# Patient Record
Sex: Male | Born: 1960 | Race: White | Hispanic: No | Marital: Married | Smoking: Never smoker
Health system: Southern US, Community
[De-identification: ages and names within clinical notes are randomized; demographics above are authoritative.]

## PROBLEM LIST (undated history)

## (undated) DIAGNOSIS — G5600 Carpal tunnel syndrome, unspecified upper limb: Secondary | ICD-10-CM

## (undated) DIAGNOSIS — K5792 Diverticulitis of intestine, part unspecified, without perforation or abscess without bleeding: Secondary | ICD-10-CM

## (undated) DIAGNOSIS — E785 Hyperlipidemia, unspecified: Secondary | ICD-10-CM

## (undated) DIAGNOSIS — G4733 Obstructive sleep apnea (adult) (pediatric): Secondary | ICD-10-CM

## (undated) DIAGNOSIS — J301 Allergic rhinitis due to pollen: Secondary | ICD-10-CM

## (undated) DIAGNOSIS — I10 Essential (primary) hypertension: Principal | ICD-10-CM

## (undated) HISTORY — DX: Diverticulitis of intestine, part unspecified, without perforation or abscess without bleeding: K57.92

## (undated) HISTORY — DX: Obstructive sleep apnea (adult) (pediatric): G47.33

## (undated) HISTORY — DX: Essential (primary) hypertension: I10

## (undated) HISTORY — DX: Allergic rhinitis due to pollen: J30.1

## (undated) HISTORY — PX: KNEE ARTHROSCOPY: SUR90

## (undated) HISTORY — PX: OTHER SURGICAL HISTORY: SHX169

## (undated) HISTORY — DX: Carpal tunnel syndrome, unspecified upper limb: G56.00

## (undated) HISTORY — DX: Hyperlipidemia, unspecified: E78.5

---

## 1999-07-16 ENCOUNTER — Ambulatory Visit (HOSPITAL_COMMUNITY): Admission: RE | Admit: 1999-07-16 | Discharge: 1999-07-16 | Payer: Self-pay | Admitting: Family Medicine

## 1999-07-16 ENCOUNTER — Encounter: Payer: Self-pay | Admitting: Family Medicine

## 2003-10-28 ENCOUNTER — Other Ambulatory Visit: Payer: Self-pay

## 2003-11-24 ENCOUNTER — Ambulatory Visit (HOSPITAL_BASED_OUTPATIENT_CLINIC_OR_DEPARTMENT_OTHER): Admission: RE | Admit: 2003-11-24 | Discharge: 2003-11-24 | Payer: Self-pay | Admitting: Family Medicine

## 2006-04-24 ENCOUNTER — Emergency Department: Payer: Self-pay | Admitting: Emergency Medicine

## 2006-05-05 ENCOUNTER — Ambulatory Visit: Payer: Self-pay | Admitting: Family Medicine

## 2007-09-24 ENCOUNTER — Ambulatory Visit: Payer: Self-pay | Admitting: Family Medicine

## 2007-09-24 ENCOUNTER — Encounter (INDEPENDENT_AMBULATORY_CARE_PROVIDER_SITE_OTHER): Payer: Self-pay | Admitting: Internal Medicine

## 2008-12-10 ENCOUNTER — Ambulatory Visit: Payer: Self-pay | Admitting: Family Medicine

## 2008-12-10 DIAGNOSIS — M25539 Pain in unspecified wrist: Secondary | ICD-10-CM | POA: Insufficient documentation

## 2009-02-18 ENCOUNTER — Encounter: Payer: Self-pay | Admitting: Family Medicine

## 2009-03-27 ENCOUNTER — Ambulatory Visit: Payer: Self-pay | Admitting: Family Medicine

## 2009-03-27 DIAGNOSIS — R21 Rash and other nonspecific skin eruption: Secondary | ICD-10-CM | POA: Insufficient documentation

## 2009-04-12 ENCOUNTER — Encounter: Payer: Self-pay | Admitting: Family Medicine

## 2009-06-18 ENCOUNTER — Encounter: Payer: Self-pay | Admitting: Family Medicine

## 2009-12-28 ENCOUNTER — Ambulatory Visit: Payer: Self-pay | Admitting: Internal Medicine

## 2009-12-28 ENCOUNTER — Emergency Department (HOSPITAL_COMMUNITY): Admission: EM | Admit: 2009-12-28 | Discharge: 2009-12-28 | Payer: Self-pay | Admitting: Emergency Medicine

## 2009-12-28 ENCOUNTER — Encounter (INDEPENDENT_AMBULATORY_CARE_PROVIDER_SITE_OTHER): Payer: Self-pay | Admitting: *Deleted

## 2009-12-28 DIAGNOSIS — R109 Unspecified abdominal pain: Secondary | ICD-10-CM | POA: Insufficient documentation

## 2009-12-28 DIAGNOSIS — R1031 Right lower quadrant pain: Secondary | ICD-10-CM | POA: Insufficient documentation

## 2009-12-28 LAB — CONVERTED CEMR LAB
Bilirubin Urine: NEGATIVE
Blood in Urine, dipstick: NEGATIVE
Glucose, Urine, Semiquant: NEGATIVE
Ketones, urine, test strip: NEGATIVE
Nitrite: NEGATIVE
Protein, U semiquant: 30
Specific Gravity, Urine: 1.01
Urobilinogen, UA: 0.2
WBC Urine, dipstick: NEGATIVE
pH: 6

## 2009-12-30 ENCOUNTER — Ambulatory Visit: Payer: Self-pay | Admitting: Family Medicine

## 2009-12-30 DIAGNOSIS — K5732 Diverticulitis of large intestine without perforation or abscess without bleeding: Secondary | ICD-10-CM | POA: Insufficient documentation

## 2010-05-17 ENCOUNTER — Ambulatory Visit: Payer: Self-pay | Admitting: Family Medicine

## 2010-05-17 DIAGNOSIS — J069 Acute upper respiratory infection, unspecified: Secondary | ICD-10-CM | POA: Insufficient documentation

## 2010-08-08 ENCOUNTER — Ambulatory Visit: Payer: Self-pay | Admitting: Family Medicine

## 2010-08-08 ENCOUNTER — Telehealth: Payer: Self-pay | Admitting: Gastroenterology

## 2010-08-08 DIAGNOSIS — R1084 Generalized abdominal pain: Secondary | ICD-10-CM | POA: Insufficient documentation

## 2010-08-08 DIAGNOSIS — K625 Hemorrhage of anus and rectum: Secondary | ICD-10-CM | POA: Insufficient documentation

## 2010-08-11 LAB — CONVERTED CEMR LAB
ALT: 63 units/L — ABNORMAL HIGH (ref 0–53)
AST: 53 units/L — ABNORMAL HIGH (ref 0–37)
Albumin: 4.6 g/dL (ref 3.5–5.2)
Alkaline Phosphatase: 66 units/L (ref 39–117)
BUN: 12 mg/dL (ref 6–23)
Basophils Absolute: 0.1 10*3/uL (ref 0.0–0.1)
Basophils Relative: 1.5 % (ref 0.0–3.0)
Bilirubin, Direct: 0.1 mg/dL (ref 0.0–0.3)
CO2: 30 meq/L (ref 19–32)
Calcium: 9.5 mg/dL (ref 8.4–10.5)
Chloride: 101 meq/L (ref 96–112)
Creatinine, Ser: 1.1 mg/dL (ref 0.4–1.5)
Eosinophils Absolute: 0.2 10*3/uL (ref 0.0–0.7)
Eosinophils Relative: 3.1 % (ref 0.0–5.0)
GFR calc non Af Amer: 73.55 mL/min (ref >60)
Glucose, Bld: 93 mg/dL (ref 70–99)
HCT: 43.4 % (ref 39.0–52.0)
Hemoglobin: 14.4 g/dL (ref 13.0–17.0)
Lipase: 40 units/L (ref 11.0–59.0)
Lymphocytes Relative: 25.6 % (ref 12.0–46.0)
Lymphs Abs: 1.6 10*3/uL (ref 0.7–4.0)
MCHC: 33.3 g/dL (ref 30.0–36.0)
MCV: 84.1 fL (ref 78.0–100.0)
Monocytes Absolute: 0.6 10*3/uL (ref 0.1–1.0)
Monocytes Relative: 9.5 % (ref 3.0–12.0)
Neutro Abs: 3.7 10*3/uL (ref 1.4–7.7)
Neutrophils Relative %: 60.3 % (ref 43.0–77.0)
Platelets: 242 10*3/uL (ref 150.0–400.0)
Potassium: 4.7 meq/L (ref 3.5–5.1)
RBC: 5.16 M/uL (ref 4.22–5.81)
RDW: 14.9 % — ABNORMAL HIGH (ref 11.5–14.6)
Sodium: 139 meq/L (ref 135–145)
Total Bilirubin: 0.6 mg/dL (ref 0.3–1.2)
Total Protein: 7.6 g/dL (ref 6.0–8.3)
WBC: 6.2 10*3/uL (ref 4.5–10.5)

## 2010-08-12 ENCOUNTER — Ambulatory Visit: Payer: Self-pay | Admitting: Gastroenterology

## 2010-08-12 ENCOUNTER — Ambulatory Visit: Payer: Self-pay | Admitting: Cardiovascular Disease

## 2010-08-12 DIAGNOSIS — R197 Diarrhea, unspecified: Secondary | ICD-10-CM | POA: Insufficient documentation

## 2010-08-12 DIAGNOSIS — K5730 Diverticulosis of large intestine without perforation or abscess without bleeding: Secondary | ICD-10-CM | POA: Insufficient documentation

## 2010-08-14 HISTORY — PX: COLONOSCOPY: SHX174

## 2010-08-15 ENCOUNTER — Telehealth: Payer: Self-pay | Admitting: Gastroenterology

## 2010-08-20 ENCOUNTER — Encounter (INDEPENDENT_AMBULATORY_CARE_PROVIDER_SITE_OTHER): Payer: Self-pay | Admitting: *Deleted

## 2010-08-21 ENCOUNTER — Ambulatory Visit: Payer: Self-pay | Admitting: Gastroenterology

## 2010-08-25 ENCOUNTER — Ambulatory Visit: Payer: Self-pay | Admitting: Gastroenterology

## 2010-08-25 LAB — HM COLONOSCOPY

## 2010-08-26 ENCOUNTER — Encounter (INDEPENDENT_AMBULATORY_CARE_PROVIDER_SITE_OTHER): Payer: Self-pay | Admitting: *Deleted

## 2010-09-30 ENCOUNTER — Encounter (INDEPENDENT_AMBULATORY_CARE_PROVIDER_SITE_OTHER): Payer: Self-pay | Admitting: *Deleted

## 2010-09-30 ENCOUNTER — Ambulatory Visit: Payer: Self-pay | Admitting: Gastroenterology

## 2010-10-20 ENCOUNTER — Telehealth: Payer: Self-pay | Admitting: Gastroenterology

## 2010-10-21 ENCOUNTER — Ambulatory Visit: Payer: Self-pay | Admitting: Gastroenterology

## 2010-10-21 LAB — CONVERTED CEMR LAB: Fecal Occult Bld: NEGATIVE

## 2010-12-14 DIAGNOSIS — K5792 Diverticulitis of intestine, part unspecified, without perforation or abscess without bleeding: Secondary | ICD-10-CM

## 2010-12-14 HISTORY — DX: Diverticulitis of intestine, part unspecified, without perforation or abscess without bleeding: K57.92

## 2011-01-13 NOTE — Assessment & Plan Note (Signed)
Summary: F/U [place] ON 12/28/09/CLE   Vital Signs:  Patient profile:   50 year old male Height:      67.5 inches Weight:      207.4 pounds BMI:     32.12 Temp:     97.7 degrees F oral Pulse rate:   100 / minute Pulse rhythm:   regular BP sitting:   160 / 90  (left arm) Cuff size:   regular  Vitals Entered By: Benny Lennert CMA Duncan Dull) (December 30, 2009 11:54 AM)  History of Present Illness: Chief complaint er follow up 12-28-09 diagnosed with diverticulitis  50 year old male:  Diverticuluitis: the patient was seen in the emergency room, and was subsequently diagnosed with diverticulitis. He is being treated with Flagyl and ciprofloxacin, he is subsequently improved over the last 3 days. he is still not eating any solid food, however he is drinking without any difficulties.  At this point he is not having any fever.  getting better, cipro and flagyl  no family history of colon cancer.  He is having any blood in his stool. Generally he is feeling better.  Allergies: 1)  ! Sb Antihistamine (Triprolidine-Pseudoephedrine) 2)  ! * Lorabid/antibiotic  Past History:  Past medical, surgical, family and social histories (including risk factors) reviewed, and no changes noted (except as noted below).  Past Medical History: CTS Obstructive sleep apnea--mild Diverticulitis, hx of  Past Surgical History: Reviewed history from 12/28/2009 and no changes required. Arthroscopy both knees (meniscus repairs)  Family History: Reviewed history and no changes required.  Social History: Reviewed history from 12/28/2009 and no changes required. Married--2 children Never Smoked Alcohol use-yes Occupation:  Armed forces training and education officer  Review of Systems General:  Complains of fatigue. CV:  Denies chest pain or discomfort. Resp:  Denies cough. GI:  Complains of abdominal pain and nausea; denies bloody stools.  Physical Exam  General:  Well-developed,well-nourished,in no acute distress;  alert,appropriate and cooperative throughout examination Head:  Normocephalic and atraumatic without obvious abnormalities. No apparent alopecia or balding. Ears:  no external deformities.   Nose:  no external deformity.   Neck:  No deformities, masses, or tenderness noted. Lungs:  Normal respiratory effort, chest expands symmetrically. Lungs are clear to auscultation, no crackles or wheezes. Heart:  Normal rate and regular rhythm. S1 and S2 normal without gallop, murmur, click, rub or other extra sounds. Abdomen:  mild left lower quadrant tenderness.soft, normal bowel sounds, no distention, no masses, no guarding, and no rigidity.     Impression & Recommendations:  Problem # 1:  DIVERTICULITIS, ACUTE (ICD-562.11) Assessment New c/w plan of care, cipro, flagyl  advance diet as tol  discussed that the ER had him f/u with Dr. Loreta Ave scheduled. My practice pattern is not usually for someone to f/u with GI for routine resolving diverticulitis, and we discussed that GI follow-up probably not indicated as long as getting better.  Complete Medication List: 1)  Ciprofloxacin Hcl 500 Mg Tabs (Ciprofloxacin hcl) .... Take 1 tablet by mouth 2 times a day 2)  Metronidazole 500 Mg Tabs (Metronidazole) .... Take 1 tab 3 times daily 3)  Ondansetron Hcl 8 Mg Tabs (Ondansetron hcl) .... Take one as needed every 4-6 hours  Current Allergies (reviewed today): ! SB ANTIHISTAMINE (TRIPROLIDINE-PSEUDOEPHEDRINE) ! * LORABID/ANTIBIOTIC

## 2011-01-13 NOTE — Assessment & Plan Note (Signed)
Summary: follow up colonoscopy/lk   History of Present Illness Visit Type: Follow-up Visit Primary GI MD: Sheryn Bison MD FACP FAGA Primary Provider: Kerin Perna, MD Chief Complaint: Occasional constipation History of Present Illness:   colonoscopy was unremarkable except for some thickened haustral folds. He is currently on a high fiber diet and daily Metamucil and is fairly asymptomatic. He denies abdominal pain, rectal bleeding, or systemic complaints. He has had at least 2 episodes of documented diverticulitis.   GI Review of Systems      Denies abdominal pain, acid reflux, belching, bloating, chest pain, dysphagia with liquids, dysphagia with solids, heartburn, loss of appetite, nausea, vomiting, vomiting blood, weight loss, and  weight gain.      Reports constipation.     Denies anal fissure, black tarry stools, change in bowel habit, diarrhea, diverticulosis, fecal incontinence, heme positive stool, hemorrhoids, irritable bowel syndrome, jaundice, light color stool, liver problems, rectal bleeding, and  rectal pain.    Current Medications (verified): 1)  Multivitamins  Tabs (Multiple Vitamin) .... Once Daily 2)  B Complex Formula 1  Tabs (Vitamins-Lipotropics) .... Once Daily 3)  Vitamin C 1000 Mg Tabs (Ascorbic Acid) .... Once Daily 4)  Ketoconazole 2 % Crea (Ketoconazole) .... Apply Two Times A Day As Needed 5)  Analpram-Hc 1-2.5 % Crea (Hydrocortisone Ace-Pramoxine) .... Use Rectal Cream 2-3 Times Daily As Needed  Allergies (verified): 1)  ! Sb Antihistamine (Triprolidine-Pseudoephedrine) 2)  ! * Lorabid/antibiotic  Past History:  Past medical, surgical, family and social histories (including risk factors) reviewed for relevance to current acute and chronic problems.  Past Medical History: Reviewed history from 08/12/2010 and no changes required. CTS Obstructive sleep apnea--mild Diverticulitis, hx of  Past Surgical History: Reviewed history from 08/29/2010  and no changes required. Arthroscopy both knees (meniscus)  Colonoscopy, 07/2010, diverticulosis, 10 yr recall  Family History: Reviewed history from 08/12/2010 and no changes required. Father had seven inches of colon removed.  many polyps noncancerous mass removed Family History of Heart Disease: MGF Family History of Kidney Disease:PGF  Social History: Reviewed history from 08/12/2010 and no changes required. Married--2 children Never Smoked Alcohol use-yes Occupation:  Armed forces training and education officer Daily Caffeine Use 2 cups  Review of Systems       The patient complains of allergy/sinus, arthritis/joint pain, and sleeping problems.  The patient denies anemia, anxiety-new, back pain, blood in urine, breast changes/lumps, change in vision, confusion, cough, coughing up blood, depression-new, fainting, fatigue, fever, headaches-new, hearing problems, heart murmur, heart rhythm changes, itching, menstrual pain, muscle pains/cramps, night sweats, nosebleeds, pregnancy symptoms, shortness of breath, skin rash, sore throat, swelling of feet/legs, swollen lymph glands, thirst - excessive , urination - excessive , urination changes/pain, urine leakage, vision changes, and voice change.    Vital Signs:  Patient profile:   50 year old male Height:      67.5 inches Weight:      199.25 pounds BMI:     30.86 Pulse rate:   84 / minute Pulse rhythm:   regular BP sitting:   116 / 80  (left arm) Cuff size:   regular  Vitals Entered By: June McMurray CMA Duncan Dull) (September 30, 2010 3:51 PM)  Physical Exam  General:  Well developed, well nourished, no acute distress.healthy appearing.  healthy appearing.   Head:  Normocephalic and atraumatic. Eyes:  PERRLA, no icterus. Abdomen:  Soft, nontender and nondistended. No masses, hepatosplenomegaly or hernias noted. Normal bowel sounds. Psych:  Alert and cooperative. Normal mood and affect.  Impression & Recommendations:  Problem # 1:  DIVERTICULOSIS-COLON  (ICD-562.10) Assessment Improved Continue high-fiber diet, daily Metamucil, and liberal p.o. fluids.Stool cards for human hemoglobin analysis ordered. He will see me on a p.r.n. basis as needed. Diverticulitis and its management again reviewed with patient.  Problem # 2:  DIARRHEA (ICD-787.91) Assessment: Improved  Problem # 3:  ABDOMINAL PAIN, GENERALIZED (ICD-789.07) Assessment: Improved  Patient Instructions: 1)  Copy sent to : Kerin Perna, MD 2)  Please go to the basement for your iFOB test, we gave you a seperate sheet for the lab.  3)  The medication list was reviewed and reconciled.  All changed / newly prescribed medications were explained.  A complete medication list was provided to the patient / caregiver. 4)  Diet should be high in fiber ( fruits, vegetables, whole grains) but low in residue. Drink at least eight (8) glasses of water a day.

## 2011-01-13 NOTE — Assessment & Plan Note (Signed)
Summary: R HAND/CLE   Vital Signs:  Patient Profile:   50 Years Old Male Weight:      212 pounds Temp:     98.7 degrees F oral Pulse rate:   80 / minute Pulse rhythm:   regular BP sitting:   130 / 80  (left arm) Cuff size:   regular  Vitals Entered By: Mervin Hack CMA (December 10, 2008 9:48 AM)                 History of Present Illness: 50 year old male presents with R hand pain:  Last thursday, hit some black ice. Slipped and reached for a metal trailer. Hit his right hand. Hurt the next morning. No bruising. OK to type fine. FOOSH with extension  Shaking hands bothers him. Opens a door bothers him. Pain with forced extension. Minimal swelling.  Denies prior fracture or surgery of the effected hand    Current Allergies (reviewed today): ! SB ANTIHISTAMINE (TRIPROLIDINE-PSEUDOEPHEDRINE)  Past Medical History:    CTS     Review of Systems       REVIEW OF SYSTEMS  GEN: No systemic complaints, no fevers, chills, sweats, or other acute illnesses  MSK: Detailed in the HPI GI: tolerating PO intake without difficulty  Neuro: No new numbness, parasthesias, or tingling associated with injury - has some at baseline  Otherwise the pertinent positives of the ROS are noted above. Further ROS may be demarcated in the ROS section of Centricity, If none, then this plus the HPI constitutes the ROS.     Physical Exam  General:     Well-developed,well-nourished,in no acute distress; alert,appropriate and cooperative throughout examination Head:     Normocephalic and atraumatic without obvious abnormalities. No apparent alopecia or balding. Ears:     no external deformities.   Nose:     no external deformity.   Pulses:     radial pulses intact Additional Exam:     R hand Ecchymosis or edema: mild dorsal swelling ROM wrist/hand/digits/elbow: full  Carpals, MCP's, digits: minimally tender dorsally Distal Ulna and Radius: NT Cysts/nodules: neg Finkelstein's  test: neg Snuffbox tenderness: neg Scaphoid tubercle: NT Hook of Hamate: NT Resisted supination: NT Full composite fist Grip, all digits: 5/5 str No tenosynovitis Axial load test: mildly tender Atrophy: neg  Hand sensation: intact     Impression & Recommendations:  Problem # 1:  WRIST PAIN, RIGHT (ZOX-096.04) Assessment: New Dorsal synovitis, traumatic Clinically does not appear to be fracture  Using an anatomy book, I reviewed with the patient the structures involved and how they related to her diagnosis. The patient indicated that they understood our discussion and the anatomy involved.   Alleve 2 tabs by mouth two times a day over the counter, take at least for 2 - 3 weeks. This is a prescription strength dose.  Ice daily, 2-3 times for 1 week  Placed in cock-up wrist splint to be used over next 2-3 weeks then wean out f/u if not getting better Orders: No RX's Sent Electronically/Printed [phone])     ] Prior Medications: None Current Allergies (reviewed today): ! SB ANTIHISTAMINE (TRIPROLIDINE-PSEUDOEPHEDRINE)

## 2011-01-13 NOTE — Assessment & Plan Note (Signed)
Summary: SINUSES/INTESTINAL PROBLEMS/RCD   Vital Signs:  Patient profile:   50 year old male Weight:      205 pounds O2 Sat:      98 % Temp:     98.0 degrees F oral Pulse rate:   88 / minute BP sitting:   130 / 80  Vitals Entered By: Lamar Sprinkles, CMA (May 17, 2010 9:25 AM) CC: Pt c/o sinus congestion/pressure/drainage x 1 wk. No fever, c/o bodyaches & chills last night. Yesterday evening c/o diarrhea w/abd discomfort. Has hx of diverticulitis.    History of Present Illness: 50 yo man here today for  1) Sinus congestion- sxs started Monday w/ nasal congestion.  + rhinorrhea.  + head congestion.  no facial pain.  taking mucinex.  + ear fullness.  mild cough.  no sore throat.  2) abd pain- started yesterday afternoon, located across upper abd.  + nausea last night, no vomiting.  diarrhea x3 yesterday afternoon.  started clear liquid diet yesterday.  pain has improved this AM.  no fevers.  hx of diverticulitis.  Current Medications (verified): 1)  Multivitamins  Tabs (Multiple Vitamin) .... Once Daily 2)  B Complex Formula 1  Tabs (Vitamins-Lipotropics) .... Once Daily 3)  Vitamin C 1000 Mg Tabs (Ascorbic Acid) .... Once Daily  Allergies (verified): 1)  ! Sb Antihistamine (Triprolidine-Pseudoephedrine) 2)  ! * Lorabid/antibiotic  Review of Systems      See HPI  Physical Exam  General:  Well-developed,well-nourished,in no acute distress; alert,appropriate and cooperative throughout examination Head:  Normocephalic and atraumatic without obvious abnormalities. No apparent alopecia or balding.  no TTP over sinuses Eyes:  no injxn or inflammation Ears:  External ear exam shows no significant lesions or deformities.  Otoscopic examination reveals clear canals, tympanic membranes are intact bilaterally without bulging, retraction, inflammation or discharge. Hearing is grossly normal bilaterally. Nose:  mild congestion Mouth:  + PND Neck:  No deformities, masses, or tenderness  noted. Lungs:  Normal respiratory effort, chest expands symmetrically. Lungs are clear to auscultation, no crackles or wheezes. Heart:  Normal rate and regular rhythm. S1 and S2 normal without gallop, murmur, click, rub or other extra sounds. Abdomen:  soft, mild TTP over LLQ, no rebound/guarding   Impression & Recommendations:  Problem # 1:  URI (ICD-465.9) Assessment New pt sxs likely viral- no evidence of bacterial infxn on exam.  reviewed supportive care and red flags that should prompt return.  Pt expresses understanding and is in agreement w/ this plan.  Problem # 2:  DIVERTICULITIS, ACUTE (ICD-562.11) Assessment: Unchanged pt w/ possible early diverticulitis althought sxs are so mild it's hard to say.  may all be related to viral illness- see above.  scripts for cipro and flagyl given w/ instructions to start only if pain recurs or worsens.  no need for CT today.  reviewed supportive care and red flags that should prompt return.  Pt expresses understanding and is in agreement w/ this plan.  Complete Medication List: 1)  Multivitamins Tabs (Multiple vitamin) .... Once daily 2)  B Complex Formula 1 Tabs (Vitamins-lipotropics) .... Once daily 3)  Vitamin C 1000 Mg Tabs (Ascorbic acid) .... Once daily 4)  Cipro 500 Mg Tabs (Ciprofloxacin hcl) .Marland Kitchen.. 1 by mouth 2 times daily 5)  Metronidazole 500 Mg Tabs (Metronidazole) .Marland Kitchen.. 1 tab by mouth three times a day x14 days  Patient Instructions: 1)  This appears to be viral and should improve with time 2)  Drink plenty of fluids- advance  diet as tolerated 3)  If your abd pain returns- start the medicines and call Dr Patsy Lager 4)  Mucinex as needed for congestion 5)  Call with any questions or concerns 6)  Hang in there! Prescriptions: METRONIDAZOLE 500 MG  TABS (METRONIDAZOLE) 1 tab by mouth three times a day x14 days  #42 x 0   Entered and Authorized by:   Neena Rhymes MD   Signed by:   Neena Rhymes MD on 05/17/2010   Method used:    [facility]        [facility]  [place] (retail)       [address]       [place], [place]  [address]       Ph: [phone] or [phone]       Fax: [phone]   RxID:   [id number] CIPRO 500 MG TABS (CIPROFLOXACIN HCL) 1 by mouth 2 times daily  #28 x 0   Entered and Authorized by:   Neena Rhymes MD   Signed by:   Neena Rhymes MD on 05/17/2010   Method used:   [facility]        [facility]  [place] (retail)       [address]       [place], [place]  [address]       Ph: [phone] or [phone]       Fax: [phone]   RxID:   [id number]    Immunization History:  Tetanus/Td Immunization History:    Tetanus/Td:  historical (04/13/2005)

## 2011-01-13 NOTE — Procedures (Signed)
Summary: Colonoscopy  Patient: [name] Note: All result statuses are Final unless otherwise noted.  Tests: (1) Colonoscopy (COL)   COL Colonoscopy           DONE     [facility]     [address]     [place], [place]  [address]           COLONOSCOPY PROCEDURE REPORT           PATIENT:  Jeffrey, Barry  MR#:  [id number]     BIRTHDATE:  Dec 24, 1960, 49 yrs. old  GENDER:  male     ENDOSCOPIST:  Vania Rea. Jarold Motto, MD, [facility]     REF. BY:     PROCEDURE DATE:  08/25/2010     PROCEDURE:  Average-risk screening colonoscopy     G0121     ASA CLASS:  Class I     INDICATIONS:  Abdominal pain, hematochezia, Routine Risk Screening           MEDICATIONS:   Fentanyl 75 mcg IV, Versed 7 mg IV           DESCRIPTION OF PROCEDURE:   After the risks benefits and     alternatives of the procedure were thoroughly explained, informed     consent was obtained.  Digital rectal exam was performed and     revealed no abnormalities.   The LB [id number] endoscope     was introduced through the anus and advanced to the cecum, which     was identified by both the appendix and ileocecal valve, without     limitations.  The quality of the prep was excellent, using     MoviPrep.  The instrument was then slowly withdrawn as the colon     was fully examined.     <<PROCEDUREIMAGES>>           FINDINGS:  Moderate diverticulosis was found in the sigmoid to     descending colon segments. thickened,red haustral folds noted.  No     polyps or cancers were seen.  This was otherwise a normal     examination of the colon.   Retroflexed views in the rectum     revealed no abnormalities.    The scope was then withdrawn from     the patient and the procedure completed.           COMPLICATIONS:  None     ENDOSCOPIC IMPRESSION:     1) Moderate diverticulosis in the sigmoid to descending colon     segments     2) No polyps or cancers     3) Otherwise normal examination     RECOMMENDATIONS:     1)  Continue current colorectal screening recommendations for     "routine risk" patients with a repeat colonoscopy in 10 years.     REPEAT EXAM:  No           ______________________________     Vania Rea. Jarold Motto, MD, Clement[facility] Graham           CC:  Juleen China, MD           n.     [name] at 08/25/2010 04:22 PM           Ursula Beath, [phone]  Note: An exclamation mark (!) indicates a result that was not dispersed into the flowsheet. Document Creation Date: 08/25/2010 4:23 PM _______________________________________________________________________  (1) Order result status: Final Collection  or observation date-time: 08/25/2010 16:16 Requested date-time:  Receipt date-time:  Reported date-time:  Referring Physician:   Ordering Physician: Sheryn Bison [phone]) Specimen Source:  Source: Launa Grill Order Number: [id number] Lab site:   Appended Document: Colonoscopy    Clinical Lists Changes  Observations: Added new observation of COLONNXTDUE: 08/2020 (08/25/2010 16:29)      Appended Document: Colonoscopy    Clinical Lists Changes  Observations: Added new observation of PAST SURG HX: Arthroscopy both knees (meniscus)  Colonoscopy, 07/2010, diverticulosis, 10 yr recall (08/29/2010 6:29)     Past History:  Past Surgical History: Arthroscopy both knees (meniscus)  Colonoscopy, 07/2010, diverticulosis, 10 yr recall

## 2011-01-13 NOTE — Letter (Signed)
Summary: [facility]   Imported By: Lanelle Bal 06/20/2009 09:30:49  _____________________________________________________________________  External Attachment:    Type:   Image     Comment:   External Document

## 2011-01-13 NOTE — Miscellaneous (Signed)
Summary: [facility] previsit  Clinical Lists Changes  Medications: Added new medication of MOVIPREP 100 GM  SOLR (PEG-KCL-NACL-NASULF-NA ASC-C) As per prep instructions. - Signed Rx of MOVIPREP 100 GM  SOLR (PEG-KCL-NACL-NASULF-NA ASC-C) As per prep instructions.;  #1 x 0;  Signed;  Entered by: Karl Bales RN;  Authorized by: Mardella Layman MD [facility];  Method used: [facility]  [place], [place], [place]  [address], Ph: [phone] or [phone], Fax: [phone]    Prescriptions: MOVIPREP 100 GM  SOLR (PEG-KCL-NACL-NASULF-NA ASC-C) As per prep instructions.  #1 x 0   Entered by:   Karl Bales RN   Authorized by:   Mardella Layman MD [facility]   Signed by:   Karl Bales RN on 08/21/2010   Method used:   [facility] to        [facility]  [address] (retail)       [address]       [place], [place]  [address]       Ph: [phone] or [phone]       Fax: [phone]   RxID:   [id number]

## 2011-01-13 NOTE — Assessment & Plan Note (Signed)
Summary: ABD PAIN...AS.   Vital Signs:  Patient profile:   50 year old male Weight:      207 pounds Temp:     98.6 degrees F oral Pulse rate:   116 / minute BP sitting:   120 / 84  (left arm)  Vitals Entered By: Doristine Devoid (December 28, 2009 9:10 AM) CC: RLQ pain xlast night- 1 episode of diarrhea yest. and some nausea   History of Present Illness: Awoke last night with right lower abdomen pain Only relief was by pacing the floor worse with sitting or lying down Feels like he has gas and can't relieve it  persists now Some nausea--hasn't eaten or drank much Small stool  ~6AM No vomiting  No fever some chills and sweats last night  Preventive Screening-Counseling & Management  Alcohol-Tobacco     Smoking Status: never  Allergies: 1)  ! Sb Antihistamine (Triprolidine-Pseudoephedrine) 2)  ! * Lorabid/antibiotic  Past History:  Past Medical History: CTS Obstructive sleep apnea--mild  Past Surgical History: Arthroscopy both knees (meniscus repairs)  Social History: Married--2 children Never Smoked Alcohol use-yes Occupation:  Armed forces training and education officer Smoking Status:  never Occupation:  employed  Review of Systems       Notes decreased urinary stream since last night No sexual problems No dysuria No hematuria No recent strain or heavy lifting  Physical Exam  General:  alert.  NAD Neck:  supple, no masses, and no cervical lymphadenopathy.   Lungs:  normal respiratory effort and normal breath sounds.   Heart:  normal rate, regular rhythm, no murmur, and no gallop.   Abdomen:  soft and no masses.   Quiet--no audible bowel sounds Mild-moderate point tenderness in RLQ mild LLQ tenderness as well  No rebound   Impression & Recommendations:  Problem # 1:  ABDOMINAL PAIN, RIGHT LOWER QUADRANT (ICD-789.03) Assessment New not overly sick but may be early appendicitis will send to ER at [facility] now for evaluation and imaging as needed He states he has  had "mild" appendicitis diagnosed twice in the past but it improved without surgery  Triage at ER notified  Complete Medication List: 1)  Mucinex 600 Mg Xr12h-tab (Guaifenesin) .... As needed 2)  Elocon 0.1 % Crea (Mometasone furoate) .... Apply thinnlyto lesion two times a day for 1 wk  Other Orders: UA Dipstick w/o Micro (manual) ([address])  Laboratory Results   Urine Tests    Routine Urinalysis   Glucose: negative   (Normal Range: Negative) Bilirubin: negative   (Normal Range: Negative) Ketone: negative   (Normal Range: Negative) Spec. Gravity: 1.010   (Normal Range: 1.003-1.035) Blood: negative   (Normal Range: Negative) pH: 6.0   (Normal Range: 5.0-8.0) Protein: 30   (Normal Range: Negative) Urobilinogen: 0.2   (Normal Range: 0-1) Nitrite: negative   (Normal Range: Negative) Leukocyte Esterace: negative   (Normal Range: Negative)

## 2011-01-13 NOTE — Letter (Signed)
Summary: [facility] / EVAL OF ELECTIVE STERILIZATION / DR. Arlys John [place]  [facility] / EVAL OF ELECTIVE STERILIZATION / DR. Arlys John [place]   Imported By: Carin Primrose 02/20/2009 12:12:02  _____________________________________________________________________  External Attachment:    Type:   Image     Comment:   External Document

## 2011-01-13 NOTE — Letter (Signed)
Summary: [facility] Instructions  [facility]  [address] [place], [place] [address]   Phone: [phone]  Fax: [phone]       KHALIL SZCZEPANIK    Nov 28, 1961    MRN: [id number]        Procedure Day Dorna Bloom:  Duanne Limerick  08/25/10     Arrival Time:  3:00PM     Procedure Time:  4:00PM     Location of Procedure:                    Juliann Pares  [facility] (4th Floor)                      PREPARATION FOR COLONOSCOPY WITH MOVIPREP   Starting 5 days prior to your procedure 08/20/10 do not eat nuts, seeds, popcorn, corn, beans, peas,  salads, or any raw vegetables.  Do not take any fiber supplements (e.g. Metamucil, Citrucel, and Benefiber).  THE DAY BEFORE YOUR PROCEDURE         DATE: 08/24/10  DAY: SUNDAY  1.  Drink clear liquids the entire day-NO SOLID FOOD  2.  Do not drink anything colored red or purple.  Avoid juices with pulp.  No orange juice.  3.  Drink at least 64 oz. (8 glasses) of fluid/clear liquids during the day to prevent dehydration and help the prep work efficiently.  CLEAR LIQUIDS INCLUDE: Water Jello Ice Popsicles Tea (sugar ok, no milk/cream) Powdered fruit flavored drinks Coffee (sugar ok, no milk/cream) Gatorade Juice: apple, white grape, white cranberry  Lemonade Clear bullion, consomm, broth Carbonated beverages (any kind) Strained chicken noodle soup Hard Candy                             4.  In the morning, mix first dose of MoviPrep solution:    Empty 1 Pouch A and 1 Pouch B into the disposable container    Add lukewarm drinking water to the top line of the container. Mix to dissolve    Refrigerate (mixed solution should be used within 24 hrs)  5.  Begin drinking the prep at 5:00 p.m. The MoviPrep container is divided by 4 marks.   Every 15 minutes drink the solution down to the next mark (approximately 8 oz) until the full liter is complete.   6.  Follow completed prep with 16 oz of clear liquid of your choice (Nothing red  or purple).  Continue to drink clear liquids until bedtime.  7.  Before going to bed, mix second dose of MoviPrep solution:    Empty 1 Pouch A and 1 Pouch B into the disposable container    Add lukewarm drinking water to the top line of the container. Mix to dissolve    Refrigerate  THE DAY OF YOUR PROCEDURE      DATE: 08/25/10  DAY: MONDAY  Beginning at 11:00AM (5 hours before procedure):         1. Every 15 minutes, drink the solution down to the next mark (approx 8 oz) until the full liter is complete.  2. Follow completed prep with 16 oz. of clear liquid of your choice.    3. You may drink clear liquids until 2:00PM (2 HOURS BEFORE PROCEDURE).   MEDICATION INSTRUCTIONS  Unless otherwise instructed, you should take regular prescription medications with a small sip of water   as early as possible the morning of your  procedure.         OTHER INSTRUCTIONS  You will need a responsible adult at least 49 years of age to accompany you and drive you home.   This person must remain in the waiting room during your procedure.  Wear loose fitting clothing that is easily removed.  Leave jewelry and other valuables at home.  However, you may wish to bring a book to read or  an iPod/MP3 player to listen to music as you wait for your procedure to start.  Remove all body piercing jewelry and leave at home.  Total time from sign-in until discharge is approximately 2-3 hours.  You should go home directly after your procedure and rest.  You can resume normal activities the  day after your procedure.  The day of your procedure you should not:   Drive   Make legal decisions   Operate machinery   Drink alcohol   Return to work  You will receive specific instructions about eating, activities and medications before you leave.    The above instructions have been reviewed and explained to me by   Karl Bales RN  August 21, 2010 9:05 AM    I fully understand and can  verbalize these instructions _____________________________ Date _________

## 2011-01-13 NOTE — Progress Notes (Signed)
Summary: CT results  Phone Note Call from Patient Call back at Home Phone [phone]   Caller: Patient Call For: Dr. Jarold Motto Reason for Call: Lab or Test Results Summary of Call: Calling about CT results Initial call taken by: Karna Christmas,  August 15, 2010 10:51 AM  Follow-up for Phone Call        Pt informed of results.  previsit and colon sch.  Rx went for bentyl. Follow-up by: Ashok Cordia RN,  August 15, 2010 1:04 PM

## 2011-01-13 NOTE — Assessment & Plan Note (Signed)
Summary: stomach issues/alc   Vital Signs:  Patient profile:   50 year old male Height:      67.5 inches Weight:      202.4 pounds BMI:     31.35 Temp:     99.2 degrees F oral Pulse rate:   92 / minute Pulse rhythm:   regular BP sitting:   120 / 80  (left arm) Cuff size:   regular  Vitals Entered By: Benny Lennert CMA Duncan Dull) (August 08, 2010 10:38 AM)  History of Present Illness: Chief complaint stomach issues  50 year old male:  was out of town  12 days ago started, then a few a few days later  -- he thought this may be another bout of diverticulitis, and so he started some ciprofloxacin and Flagyl that he had already. Not clear if he had this from a prior time and I prescribed her from another physician..  pain in the abdomen and some pain in the back  and also in the lower abdomen multiple different places, without being particularly focal. He is able to eat but, but he does have some mild discomfort.  Additionally, he has had some rectal bleeding,, but not profuse.  He has never had a colonoscopy.  He does have a history in his family of polyps in his father, but no known colon cancer history pretty  Today is feeling better than before.   some decreased apetitie,  no bomitting or diarrhea some blood, some pain with going to the bathroom.    Allergies: 1)  ! Sb Antihistamine (Triprolidine-Pseudoephedrine) 2)  ! * Lorabid/antibiotic  Past History:  Past medical, surgical, family and social histories (including risk factors) reviewed, and no changes noted (except as noted below).  Past Medical History: Reviewed history from 12/30/2009 and no changes required. CTS Obstructive sleep apnea--mild Diverticulitis, hx of  Past Surgical History: Arthroscopy both knees (meniscus)  Family History: Reviewed history and no changes required. Father had seven inches of colon removed.  many polyps  Social History: Reviewed history from 12/28/2009 and no changes  required. Married--2 children Never Smoked Alcohol use-yes Occupation:  Armed forces training and education officer  Review of Systems General:  Complains of fatigue and loss of appetite; denies chills and fever. GI:  Complains of abdominal pain, bloody stools, change in bowel habits, constipation, and loss of appetite; denies vomiting and vomiting blood.  Physical Exam  General:  Well-developed,well-nourished,in no acute distress; alert,appropriate and cooperative throughout examination Head:  Normocephalic and atraumatic without obvious abnormalities. No apparent alopecia or balding. Ears:  no external deformities.   Nose:  no external deformity.   Lungs:  normal respiratory effort.   Abdomen:  mildly tender, nonfocal. soft, normal bowel sounds, no distention, no masses, no guarding, no rigidity, no rebound tenderness, no abdominal hernia, no inguinal hernia, no hepatomegaly, and no splenomegaly.   Extremities:  No clubbing, cyanosis, edema, or deformity noted with normal full range of motion of all joints.   Neurologic:  alert & oriented X3 and gait normal.   Cervical Nodes:  No lymphadenopathy noted Psych:  Cognition and judgment appear intact. Alert and cooperative with normal attention span and concentration. No apparent delusions, illusions, hallucinations   Impression & Recommendations:  Problem # 1:  ABDOMINAL PAIN, GENERALIZED (ICD-789.07) Assessment New abdominal pain, exact etiology unclear.  His artery had a full 10 days of metronidazole and Cipro. I don't think this is diverticulitis.  I'll check some basic laboratories. He is feeling a little bit better. At age  49, having not had a prior colonoscopy before and with some rectal bleeding, I think he does need gastroenterological evaluation. Hemoccult in the office today was negative. He did see gross blood on more than one occaision.  Orders: Venipuncture (55732) Gastroenterology Referral (GI) TLB-BMP (Basic Metabolic Panel-BMET)  (80048-METABOL) TLB-CBC Platelet - w/Differential (85025-CBCD) TLB-Hepatic/Liver Function Pnl (80076-HEPATIC) TLB-Lipase (83690-LIPASE)  Problem # 2:  RECTAL BLEEDING (ICD-569.3) Assessment: New  Orders: Gastroenterology Referral (GI) Hemoccult Guaiac-1 spec.(in office) (82270)  Complete Medication List: 1)  Multivitamins Tabs (Multiple vitamin) .... Once daily 2)  B Complex Formula 1 Tabs (Vitamins-lipotropics) .... Once daily 3)  Vitamin C 1000 Mg Tabs (Ascorbic acid) .... Once daily 4)  Metronidazole 500 Mg Tabs (Metronidazole) .Marland Kitchen.. 1 tab by mouth three times a day x14 days 5)  Ketoconazole 2 % Crea (Ketoconazole) .... Apply two times a day  Patient Instructions: 1)  Referral Appointment Information 2)  Day/Date: 3)  Time: 4)  Place/MD: 5)  Address: 6)  Phone/Fax: 7)  Patient given appointment information. Information/Orders faxed/mailed.  Prescriptions: KETOCONAZOLE 2 % CREA (KETOCONAZOLE) Apply two times a day  #60 grams x 0   Entered and Authorized by:   Hannah Beat MD   Signed by:   Hannah Beat MD on 08/08/2010   Method used:   [facility]        [facility]  [address]retail[facility]       [address]       [place], [place]  [address]       Ph: [phone] or [phone]       Fax: [phone]   RxID:   [id number]   Current Allergies (reviewed today): ! SB ANTIHISTAMINE (TRIPROLIDINE-PSEUDOEPHEDRINE) ! * LORABID/ANTIBIOTIC

## 2011-01-13 NOTE — Consult Note (Signed)
Summary: [facility]  [facility]   Imported By: Beau Fanny 09/28/2007 11:19:37  _____________________________________________________________________  External Attachment:    Type:   Image     Comment:   External Document

## 2011-01-13 NOTE — Letter (Signed)
Summary: [facility]: Immunoassay Fecal Occult Blood (iFOB) Order Form  [facility]  [address] [place], [place] [address]   Phone: [phone]  Fax: [phone]      [facility]: Immunoassay Fecal Occult Blood (iFOB) Order Form   September 30, 2010 MRN: [id number]   Jeffrey Barry [birth year]   Physicican Name: Dr. Sheryn Bison Diagnosis Code: 562.11      [name] CMA (AAMA)

## 2011-01-13 NOTE — Assessment & Plan Note (Signed)
Summary: CHECK PLACE ON LEG/CLE   Vital Signs:  Patient profile:   50 year old male Height:      67.5 inches Weight:      205 pounds BMI:     31.75 Temp:     98.6 degrees F oral Pulse rate:   72 / minute Pulse rhythm:   regular BP sitting:   112 / 80  (left arm) Cuff size:   regular  Vitals Entered By: Sydell Axon (March 27, 2009 4:09 PM)  CC:  check place on leg, has been present for 2 months, and senstive to the touch.  History of Present Illness: Here for lesion on R lower  leg for 4mo--tender to touch, itches --taking nothing --no unusual activities, contacts, does not work in the yard  Allergies: 1)  ! Sb Antihistamine (Triprolidine-Pseudoephedrine) 2)  ! * Lorabid/antibiotic  Review of Systems      See HPI  Physical Exam  General:  alert, well-developed, well-nourished, and well-hydrated.   Skin:  patch of slightly raised dapink rash on medial R lower leg, pinpoint vesicles at top of lesion, clean and skin intact, no scaling, is  ~6x12mm Psych:  normally interactive and good eye contact.     Impression & Recommendations:  Problem # 1:  SKIN RASH (ICD-782.1) Assessment New  etiology of rash is not clear, will try steriod cream for 1 wk  if not improved in 1 wk will refer to derm--agrees  His updated medication list for this problem includes:    Elocon 0.1 % Crea (Mometasone furoate) .Marland Kitchen... Apply thinnlyto lesion two times a day for 1 wk  Complete Medication List: 1)  Mucinex 600 Mg Xr12h-tab (Guaifenesin) .... As needed 2)  Elocon 0.1 % Crea (Mometasone furoate) .... Apply thinnlyto lesion two times a day for 1 wk Prescriptions: ELOCON 0.1 % CREA (MOMETASONE FUROATE) apply thinnlyto lesion two times a day for 1 wk  #1 x 0   Entered and Authorized by:   Gildardo Griffes FNP   Signed by:   Gildardo Griffes FNP on 03/27/2009   Method used:   Print then Give to Patient   RxID:   [id number]   Current Allergies (reviewed today): ! SB  ANTIHISTAMINE (TRIPROLIDINE-PSEUDOEPHEDRINE) ! * LORABID/ANTIBIOTIC

## 2011-01-13 NOTE — Letter (Signed)
Summary: Appt Reminder 2  [facility]  [address] [place], [place] [address]   Phone: [phone]  Fax: [phone]        August 26, 2010 MRN: [id number]    Jeffrey Barry [address] [place], [place]  [address]    Dear [name],   You have a return appointment with Dr. Jarold Motto on 09/30/2010 at 3:45pm.  Please remember to bring a complete list of the medicines you are taking, your insurance card and your co-pay.  If you have to cancel or reschedule this appointment, please call before 5:00 pm the evening before to avoid a cancellation fee.  If you have any questions or concerns, please call [phone].    Sincerely,    [name] CMA (AAMA)  Appended Document: Appt Reminder 2 mailed   Appended Document: Appt Reminder 2 also called and left a message on the patients machine

## 2011-01-13 NOTE — Assessment & Plan Note (Signed)
Summary: Rectal Bleeding/dfs   History of Present Illness Visit Type: Initial Visit Primary GI MD: Sheryn Bison MD FACP FAGA Primary Provider: Kerin Perna, MD Chief Complaint: rectal bleeding x 2 weeks, hemorrhoids and some rectal pain.  Pt. has had a flare of diverticulitis History of Present Illness:   50 YO MALE ,NEW TO GI TODAY. HE IS REFERRED WITH C/O RECENT DIVERTICULITIS WITH PERSISTENT SXS. INITIALLY DX IN JAN.2011 WITH DIVERTICULITIS ON CT-DESCENDING COLON. HE RESOLVED WITH ABX PER DR. Dallas Schimke. HE DID WELL UNTIL A FEW WEEKS AGO WITH RECURRENT RIGHT LOWER ABDOMINAL PAIN,AND SUPRAPUBIC PAIN. HE WAS TREATED WITH A COURSE OF CIPRO AND FLAGYL. LABS ON 8/26 UNREMARKABLE. HE JUST FINISHED THE ABX AND FEELS BETTER THAN HE DID INITIALLY BUT CONTINUES WITH AN UNSETTLED FEELING IN HIS ABDOMEN,INTERMITTENT RLQ PAIN. HE IS HAVING LOOSE STOOLS WITH 5 BM'S PER DAY. HE HAS NOTED SOME BRB WITH WIPING AND OCCASIONALLY MIXED WITH HIS STOOL OVER THE PAST COUPLE WEEKS. NO FEVER. APPETITE OK. NO N/V,WEIGHT STABLE.HE HAS HAD SOME MILD RECTAL DISCOMFORT.   GI Review of Systems      Denies abdominal pain, acid reflux, belching, bloating, chest pain, dysphagia with liquids, dysphagia with solids, heartburn, loss of appetite, nausea, vomiting, vomiting blood, weight loss, and  weight gain.      Reports change in bowel habits, diverticulosis, hemorrhoids, rectal bleeding, and  rectal pain.     Denies anal fissure, black tarry stools, constipation, diarrhea, fecal incontinence, heme positive stool, irritable bowel syndrome, jaundice, light color stool, and  liver problems.    Current Medications (verified): 1)  Multivitamins  Tabs (Multiple Vitamin) .... Once Daily 2)  B Complex Formula 1  Tabs (Vitamins-Lipotropics) .... Once Daily 3)  Vitamin C 1000 Mg Tabs (Ascorbic Acid) .... Once Daily 4)  Ketoconazole 2 % Crea (Ketoconazole) .... Apply Two Times A Day As Needed  Allergies (verified): 1)  ! Sb  Antihistamine (Triprolidine-Pseudoephedrine) 2)  ! * Lorabid/antibiotic  Past History:  Past Medical History: CTS Obstructive sleep apnea--mild Diverticulitis, hx of  Past Surgical History: Reviewed history from 08/08/2010 and no changes required. Arthroscopy both knees (meniscus)  Family History: Reviewed history from 08/08/2010 and no changes required. Father had seven inches of colon removed.  many polyps noncancerous mass removed Family History of Heart Disease: MGF Family History of Kidney Disease:PGF  Social History: Reviewed history from 12/28/2009 and no changes required. Married--2 children Never Smoked Alcohol use-yes Occupation:  Armed forces training and education officer Daily Caffeine Use 2 cups  Review of Systems       The patient complains of arthritis/joint pain.  The patient denies allergy/sinus, anemia, anxiety-new, back pain, blood in urine, breast changes/lumps, change in vision, confusion, cough, coughing up blood, depression-new, fainting, fatigue, fever, headaches-new, hearing problems, heart murmur, heart rhythm changes, itching, muscle pains/cramps, night sweats, nosebleeds, shortness of breath, skin rash, sleeping problems, sore throat, swelling of feet/legs, swollen lymph glands, thirst - excessive, urination - excessive, urination changes/pain, urine leakage, vision changes, and voice change.         SEE HPI  Vital Signs:  Patient profile:   50 year old male Height:      67.5 inches Weight:      203.8 pounds BMI:     31.56 Pulse rate:   84 / minute Pulse rhythm:   regular BP sitting:   130 / 86  (left arm)  Vitals Entered By: Milford Cage NCMA (August 12, 2010 8:43 AM)  Physical Exam  General:  Well developed, well nourished,  no acute distress. Head:  Normocephalic and atraumatic. Eyes:  PERRLA, no icterus. Lungs:  Clear throughout to auscultation. Heart:  Regular rate and rhythm; no murmurs, rubs,  or bruits. Abdomen:  SOFT,TENDER RLQ,LLQ AND SUPRAPUBIC. NO  GUARDING, NO MASS OR HSM,BS+ Rectal:  HEME  NEGATIVE,NO HEMORRHOIDS Neurologic:  Alert and  oriented x4;  grossly normal neurologically. Psych:  Alert and cooperative. Normal mood and affect.   Impression & Recommendations:  Problem # 1:  ABDOMINAL PAIN, RIGHT LOWER QUADRANT (ICD-789.03) Assessment Deteriorated 50 YO MALE WITH HXOF DIVERTICULITIS WITH RECURRENT SXS,PARTIALLY RELIEVED WITH 10 DAY COURSE OF CIPR/FLAGYL-PERSISTENT LOOSE STOOLS,SMALL VOLUME HEMATOCHEZIA,RLQ AND LLQ DISCOMFORT. R/O PERSISTENT DIVERTICULITIS,R/O UNDERLYING IBD.  CT ABD/PELVIS-IF NEGATIVE FOR DIVERTICULITIS WILL SCHEDULE FOR COLONOSCOPY WITH DR. PATTERSON ANALPRAM 2.5% CREAM-3-4 X DAILY  X 2 WEEKS THEN AS NEEDED. FURTHER PLANS PENDING CT.  Other Orders: CT Abdomen/Pelvis with Contrast (CT Abd/Pelvis w/con)  Patient Instructions: 1)  We have scheduled the CT for today at [facility].  Directions and contrast given. 2)  We have given you samples of Analpram HC cream.  3)  Copy sent to :  Kerin Perna, MD 4)  The medication list was reviewed and reconciled.  All changed / newly prescribed medications were explained.  A complete medication list was provided to the patient / caregiver. Prescriptions: ANALPRAM-HC 1-2.5 % CREA (HYDROCORTISONE ACE-PRAMOXINE) Use rectal cream 2-3 times daily as needed  #30 cc x 0   Entered by:   Lowry Ram NCMA   Authorized by:   Sammuel Cooper PA-c   Signed by:   Lowry Ram NCMA on 08/12/2010   Method used:   [facility]  [address] [facility]       [address]       [place], [place]  [address]       Ph: [phone] or [phone]       Fax: [phone]   RxID:   [id number]

## 2011-01-13 NOTE — Progress Notes (Signed)
Summary: New patient-would like pt seen next we  Phone Note Call from Patient   Caller: [phone] [place]-Dr Dallas Schimke Call For: Dr Jarold Motto ( Doc of the Day)  Summary of Call: Requesting patient be seen next week for abd pain with gross blood in stools Initial call taken by: Leanor Kail [facility],  August 08, 2010 12:16 PM  Follow-up for Phone Call        Appt sch for pt to see Amy Esterwood on 08/13/10.  [place] notified.  She will contact pt. Follow-up by: Ashok Cordia RN,  August 08, 2010 12:53 PM

## 2011-01-13 NOTE — Letter (Signed)
Summary: [facility]/Dr. Achilles Dunk  [facility]/Dr. Achilles Dunk   Imported By: Eleonore Chiquito 04/15/2009 12:06:04  _____________________________________________________________________  External Attachment:    Type:   Image     Comment:   External Document

## 2011-01-13 NOTE — Progress Notes (Signed)
Summary: iFob  Phone Note Outgoing Call Call back at [facility] Phone [phone]   Call placed by: [name] CMA [facility]ull),  October 20, 2010 8:52 AM Call placed to: Patient Summary of Call: called pt and he states that he mailed the iFob back on 10/02/2010, spoke to karen in the lab and she has not recieved it back yet. Initial call taken by: [name] CMA [facility]),  October 20, 2010 8:54 AM

## 2011-03-01 LAB — BASIC METABOLIC PANEL
BUN: 16 mg/dL (ref 6–23)
CO2: 28 mEq/L (ref 19–32)
Calcium: 9.4 mg/dL (ref 8.4–10.5)
Chloride: 104 mEq/L (ref 96–112)
Creatinine, Ser: 1.05 mg/dL (ref 0.4–1.5)
GFR calc Af Amer: >60 mL/min (ref >60)
GFR calc non Af Amer: >60 mL/min (ref >60)
Glucose, Bld: 102 mg/dL — ABNORMAL HIGH (ref 70–99)
Potassium: 4 mEq/L (ref 3.5–5.1)
Sodium: 139 mEq/L (ref 135–145)

## 2011-03-01 LAB — DIFFERENTIAL
Basophils Absolute: 0 10*3/uL (ref 0.0–0.1)
Basophils Relative: 0 % (ref 0–1)
Eosinophils Absolute: 0.1 10*3/uL (ref 0.0–0.7)
Eosinophils Relative: 1 % (ref 0–5)
Lymphocytes Relative: 12 % (ref 12–46)
Lymphs Abs: 1.2 10*3/uL (ref 0.7–4.0)
Monocytes Absolute: 0.8 10*3/uL (ref 0.1–1.0)
Monocytes Relative: 8 % (ref 3–12)
Neutro Abs: 7.9 10*3/uL — ABNORMAL HIGH (ref 1.7–7.7)
Neutrophils Relative %: 79 % — ABNORMAL HIGH (ref 43–77)

## 2011-03-01 LAB — CBC
HCT: 42 % (ref 39.0–52.0)
Hemoglobin: 13.8 g/dL (ref 13.0–17.0)
MCHC: 33 g/dL (ref 30.0–36.0)
MCV: 80.3 fL (ref 78.0–100.0)
Platelets: 249 10*3/uL (ref 150–400)
RBC: 5.23 MIL/uL (ref 4.22–5.81)
RDW: 15.3 % (ref 11.5–15.5)
WBC: 10.1 10*3/uL (ref 4.0–10.5)

## 2011-05-06 ENCOUNTER — Encounter: Payer: Self-pay | Admitting: Family Medicine

## 2011-05-07 ENCOUNTER — Encounter: Payer: Self-pay | Admitting: Family Medicine

## 2011-05-07 ENCOUNTER — Ambulatory Visit (INDEPENDENT_AMBULATORY_CARE_PROVIDER_SITE_OTHER): Payer: Managed Care, Other (non HMO) | Admitting: Family Medicine

## 2011-05-07 VITALS — BP 122/76 | HR 84 | Temp 98.1°F | Wt 201.0 lb

## 2011-05-07 DIAGNOSIS — IMO0001 Reserved for inherently not codable concepts without codable children: Secondary | ICD-10-CM

## 2011-05-07 DIAGNOSIS — R030 Elevated blood-pressure reading, without diagnosis of hypertension: Secondary | ICD-10-CM

## 2011-05-07 NOTE — Progress Notes (Signed)
  Subjective:    Patient ID: Jeffrey Barry, male    DOB: 1961-10-03, 50 y.o.   MRN: [id number]  HPI Pt of Dr Cyndie Chime here as acute visit for high BP before giving blood a few days ago at 138/100.  He denies headache or vision changes. He is to the point he does not get uptight giving blood anymore...he dopes it regularly every 8 weeks.    Review of SystemsPos FH HTN in mosty of his family     Objective:   Physical Exam  Constitutional: He appears well-developed and well-nourished. No distress.  HENT:  Head: Normocephalic and atraumatic.  Right Ear: External ear normal.  Left Ear: External ear normal.  Nose: Nose normal.  Mouth/Throat: Oropharynx is clear and moist.  Eyes: Conjunctivae and EOM are normal. Pupils are equal, round, and reactive to light. Right eye exhibits no discharge. Left eye exhibits no discharge.  Neck: Normal range of motion. Neck supple.  Cardiovascular: Normal rate and regular rhythm.   Pulmonary/Chest: Effort normal and breath sounds normal. He has no wheezes.  Lymphadenopathy:    He has no cervical adenopathy.  Skin: He is not diaphoretic.          Assessment & Plan:  EklevatedBP, not today  Keep track of BP daily at same relative time amnd record. RTC one month for assessment.

## 2011-05-07 NOTE — Patient Instructions (Signed)
RTC one month wiuth DR Copland for BP assessment. Bring BP numbers with.

## 2011-06-15 ENCOUNTER — Encounter: Payer: Self-pay | Admitting: Family Medicine

## 2011-06-15 ENCOUNTER — Ambulatory Visit (INDEPENDENT_AMBULATORY_CARE_PROVIDER_SITE_OTHER): Payer: Managed Care, Other (non HMO) | Admitting: Family Medicine

## 2011-06-15 DIAGNOSIS — J301 Allergic rhinitis due to pollen: Secondary | ICD-10-CM

## 2011-06-15 DIAGNOSIS — I10 Essential (primary) hypertension: Secondary | ICD-10-CM

## 2011-06-15 DIAGNOSIS — J309 Allergic rhinitis, unspecified: Secondary | ICD-10-CM | POA: Insufficient documentation

## 2011-06-15 DIAGNOSIS — R030 Elevated blood-pressure reading, without diagnosis of hypertension: Secondary | ICD-10-CM | POA: Insufficient documentation

## 2011-06-15 HISTORY — DX: Allergic rhinitis due to pollen: J30.1

## 2011-06-15 HISTORY — DX: Essential (primary) hypertension: I10

## 2011-06-15 MED ORDER — HYDROCHLOROTHIAZIDE 12.5 MG PO CAPS: 12.5000 mg | ORAL_CAPSULE | Freq: Every day | ORAL | Status: DC

## 2011-06-15 MED ORDER — FLUTICASONE PROPIONATE 50 MCG/ACT NA SUSP: 2.0000 | Freq: Every day | NASAL | Status: DC

## 2011-06-15 NOTE — Patient Instructions (Signed)
F/u at your convenience for complete physical later in year

## 2011-06-15 NOTE — Progress Notes (Signed)
Jeffrey Barry, a 50 y.o. male presents today in the office for the following:   HTN: new onset dx Brings in BP checks 130-150's/ 70's-100's No CP, no sob. No HA.  BP Readings from Last 3 Encounters:  06/15/11 140/80  05/07/11 122/76  09/30/10 116/80    Basic Metabolic Panel:    Component Value Date/Time   NA 139 08/08/2010 1133   K 4.7 08/08/2010 1133   CL 101 08/08/2010 1133   CO2 30 08/08/2010 1133   BUN 12 08/08/2010 1133   CREATININE 1.1 08/08/2010 1133   GLUCOSE 93 08/08/2010 1133   CALCIUM 9.5 08/08/2010 1133   2. AR, poor control, taking mucinex, cannot take anti-H  The PMH, PSH, Social History, Family History, Medications, and allergies have been reviewed in [facility], and have been updated if relevant.  ROS: GEN: No acute illnesses, no fevers, chills. GI: No n/v/d, eating normally Pulm: No SOB Interactive and getting along well at home.  Otherwise, ROS is as per the HPI.   Physical Exam  Blood pressure 140/80, pulse 92, temperature 97.9 F (36.6 C), temperature source Oral, height 5\' 9"  (1.753 m), weight 205 lb 12.8 oz (93.35 kg), SpO2 98.00%.  GEN: WDWN, NAD, Non-toxic, A & O x 3 HEENT: Atraumatic, Normocephalic. Neck supple. No masses, No LAD. Ears and Nose: No external deformity. CV: RRR, No M/G/R. No JVD. No thrill. No extra heart sounds. PULM: CTA B, no wheezes, crackles, rhonchi. No retractions. No resp. distress. No accessory muscle use. ABD: S, NT, ND, +BS. No rebound tenderness. No HSM.  EXTR: No c/c/e NEURO Normal gait.  PSYCH: Normally interactive. Conversant. Not depressed or anxious appearing.  Calm demeanor.   Assessment and Plan: 1. HTN, new, start HCTZ 2. AR, unstable, trial of flonase

## 2011-08-09 IMAGING — CT CT ABD-PELV W/ CM
2 of 5 series · 17 of 46 positions shown, 19 images · IV contrast (Omnipaque 300)
Comparison: 12/28/2009

CLINICAL DATA: Rectal bleeding, history of diverticulitis, bloody
loose stools

CT ABDOMEN AND PELVIS WITH CONTRAST
TECHNIQUE: Multidetector CT imaging of the abdomen and pelvis was
performed following the standard protocol during bolus
administration of intravenous contrast.
Contrast: 125 ml [id number]

[Series 2: abd/ pel 5mm · axial · 0.84mm/px · z∈[-502,-52]mm · 14 of 102 slices shown, 16 images]
[im 6/102  soft-tissue]
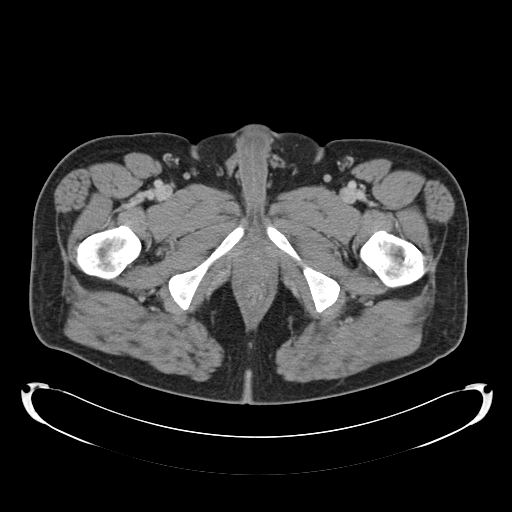
[im 6/102  bone]
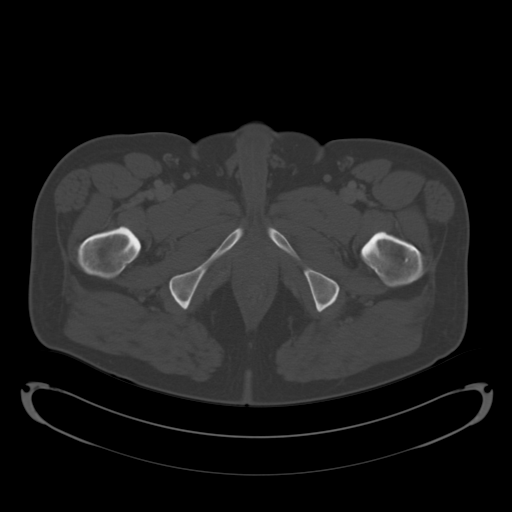
[im 11/102  soft-tissue]
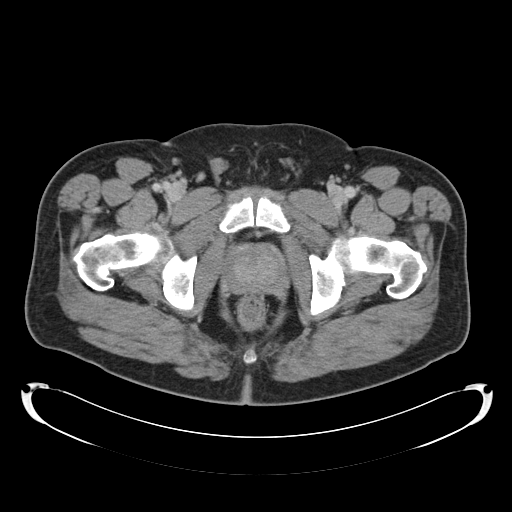
[im 22/102  soft-tissue]
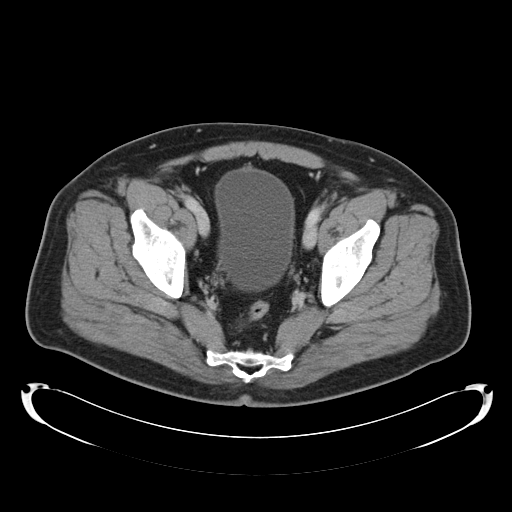
[im 27/102  soft-tissue]
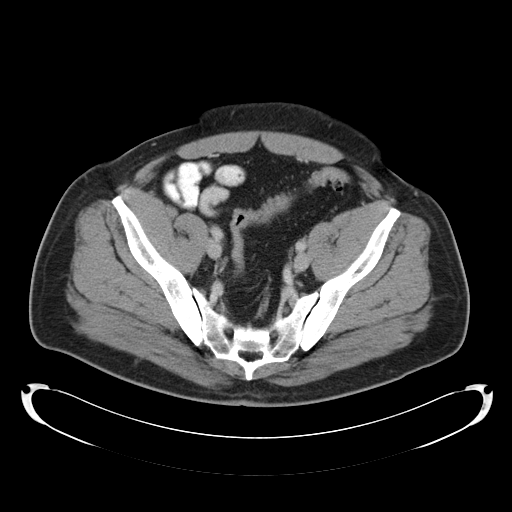
[im 32/102  soft-tissue]
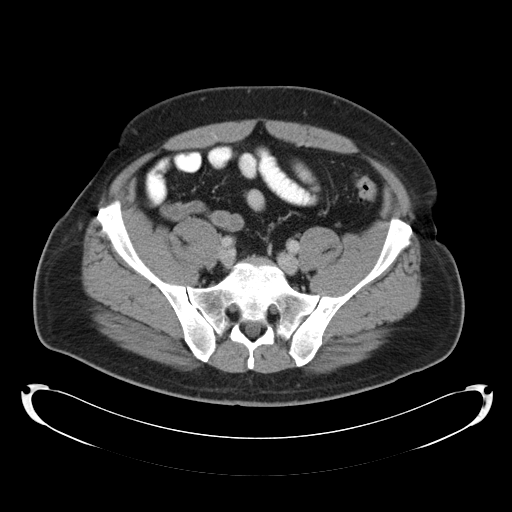
[im 43/102  soft-tissue]
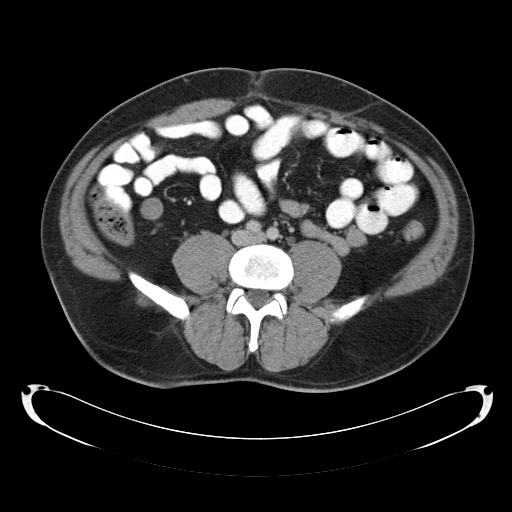
[im 48/102  soft-tissue]
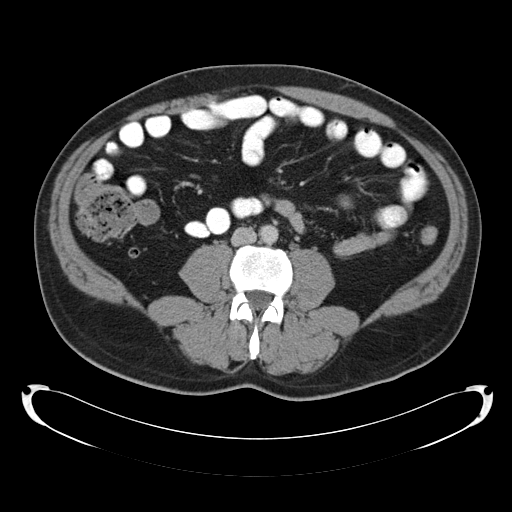
[im 54/102  soft-tissue]
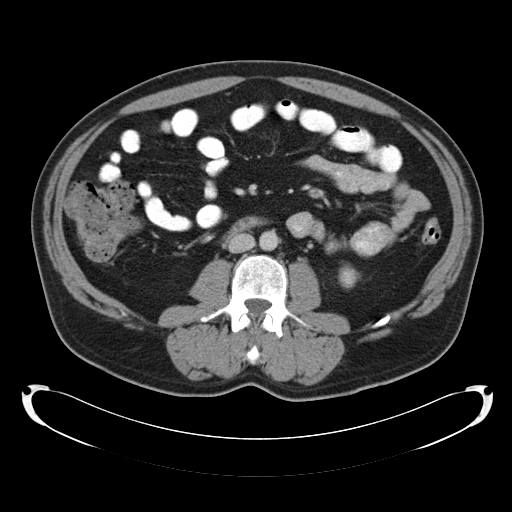
[im 59/102  soft-tissue]
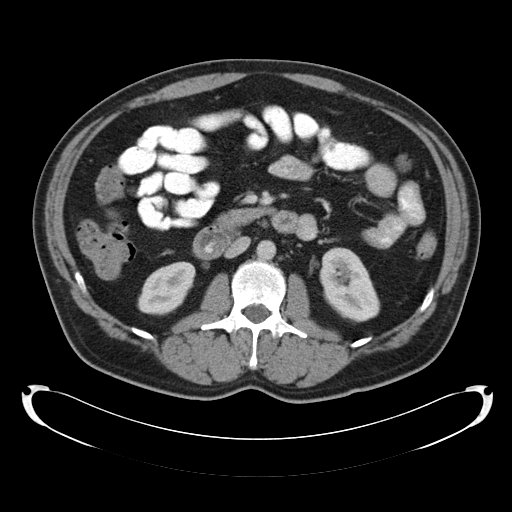
[im 59/102  bone]
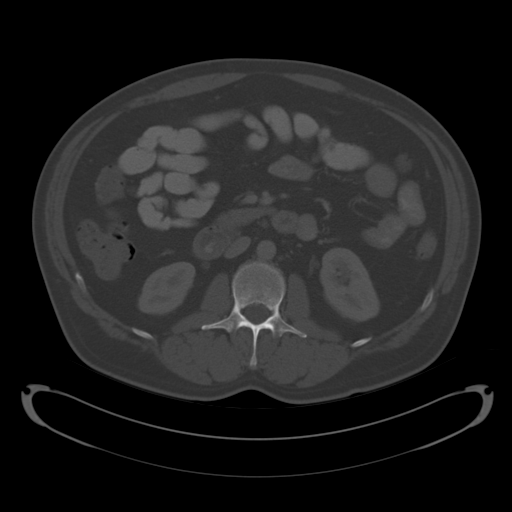
[im 70/102  soft-tissue]
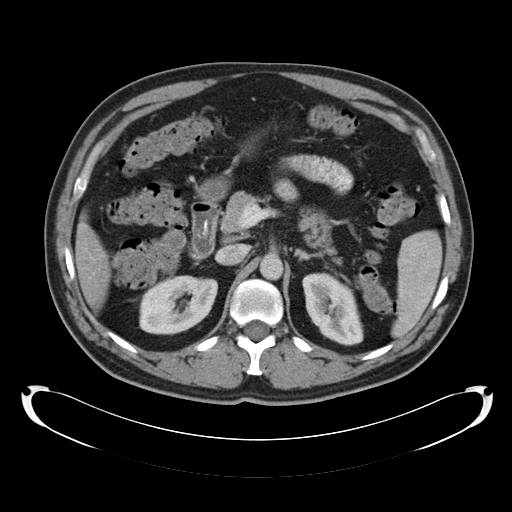
[im 75/102  soft-tissue]
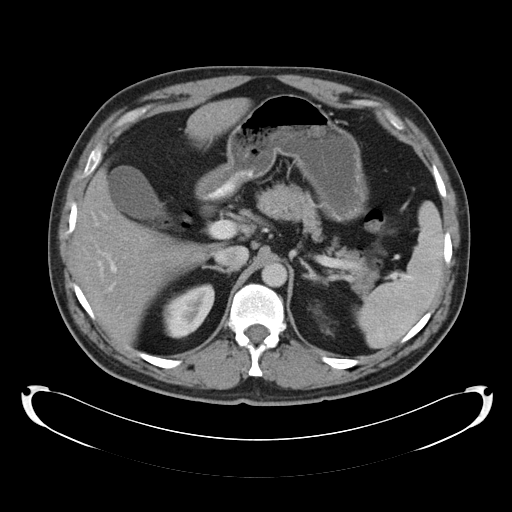
[im 80/102  soft-tissue]
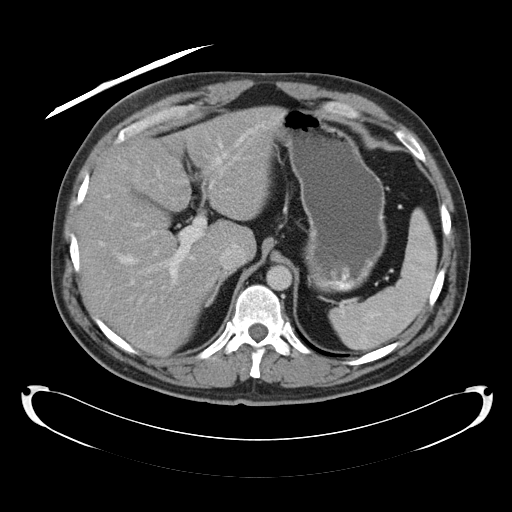
[im 91/102  soft-tissue]
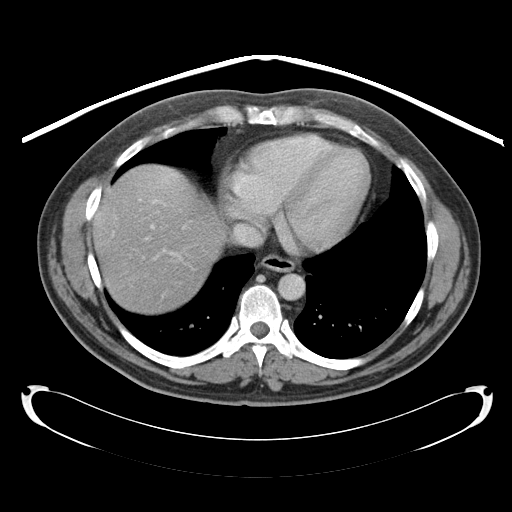
[im 96/102  soft-tissue]
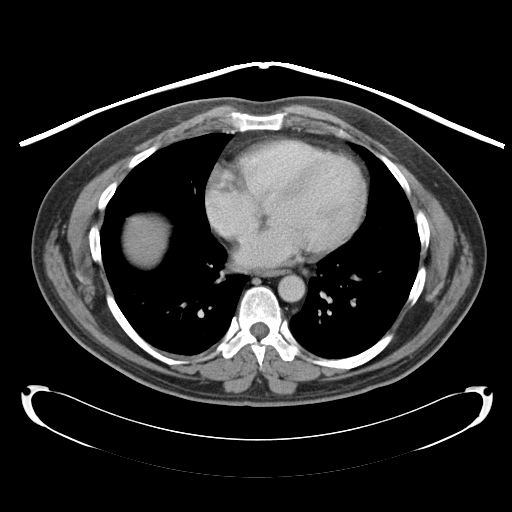

[Series 602: <mpr range> · coronal · 0.99mm/px · 3 of 128 slices shown]
[im 43/128  soft-tissue]
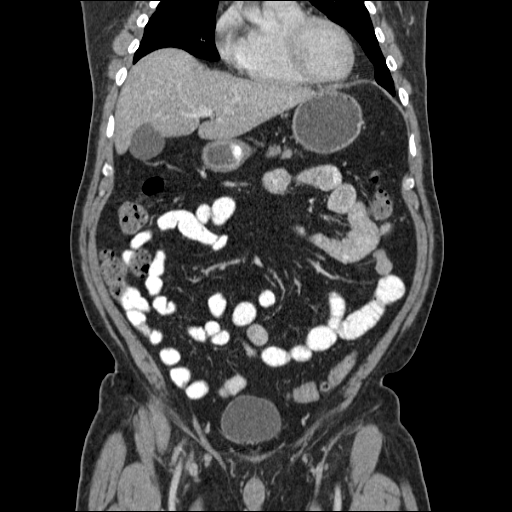
[im 57/128  soft-tissue]
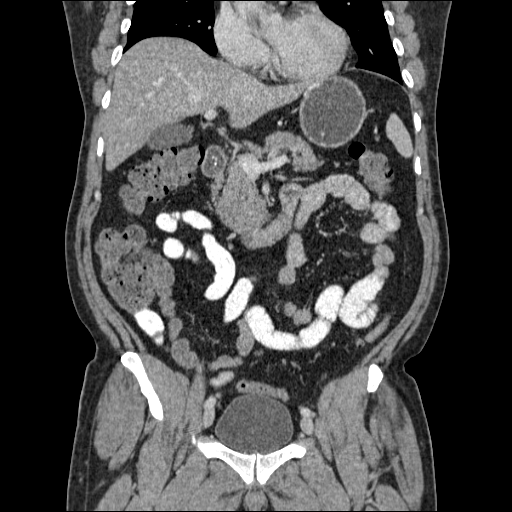
[im 71/128  soft-tissue]
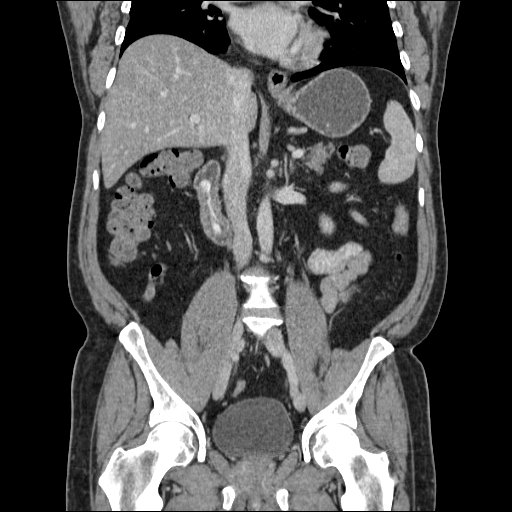

[17 of 46 positions shown; findings below may reference images not displayed]

FINDINGS: Lung bases clear.  Normal heart size.  No pericardial or
pleural effusion.

Abdomen:  Diffuse hypoattenuation of the liver parenchyma
consistent with fatty infiltration.  No focal hepatic abnormality.
The hepatic and portal veins are patent.  The gallbladder, biliary
system, pancreas, spleen, adrenal glands, and kidneys are within
normal limits for age.  No abdominal free fluid, fluid collection,
hemorrhage, abscess, or adenopathy.  No bowel obstruction pattern,
dilatation, ileus or free air.  Terminal ileum and appendix are
within normal limits and demonstrate no acute process in the right
lower quadrant.

Pelvis:  Scattered diverticulosis of the left descending colon and
sigmoid.  Sigmoid and rectum are decompressed.  No CT findings to
suggest recurrent or acute diverticulitis.  No pelvic free fluid,
fluid collection, hemorrhage, abscess, adenopathy, or inguinal
abnormality.

Minimal atherosclerosis of the iliac vessels.  No acute vascular
abnormality.

Degenerative changes noted of the spine which are minor.  No acute
osseous finding demonstrated.
IMPRESSION: No acute intra-abdominal or pelvic process. Diverticulosis.

Fatty infiltration of the liver

## 2012-01-04 ENCOUNTER — Ambulatory Visit (INDEPENDENT_AMBULATORY_CARE_PROVIDER_SITE_OTHER): Payer: Managed Care, Other (non HMO) | Admitting: Family Medicine

## 2012-01-04 ENCOUNTER — Encounter: Payer: Self-pay | Admitting: Family Medicine

## 2012-01-04 DIAGNOSIS — R109 Unspecified abdominal pain: Secondary | ICD-10-CM

## 2012-01-04 DIAGNOSIS — R1030 Lower abdominal pain, unspecified: Secondary | ICD-10-CM

## 2012-01-04 DIAGNOSIS — K5732 Diverticulitis of large intestine without perforation or abscess without bleeding: Secondary | ICD-10-CM

## 2012-01-04 DIAGNOSIS — K5792 Diverticulitis of intestine, part unspecified, without perforation or abscess without bleeding: Secondary | ICD-10-CM

## 2012-01-04 LAB — HEPATIC FUNCTION PANEL
ALT: 53 U/L (ref 0–53)
AST: 36 U/L (ref 0–37)
Albumin: 4.5 g/dL (ref 3.5–5.2)
Alkaline Phosphatase: 64 U/L (ref 39–117)
Bilirubin, Direct: 0.1 mg/dL (ref 0.0–0.3)
Total Bilirubin: 0.7 mg/dL (ref 0.3–1.2)
Total Protein: 7.9 g/dL (ref 6.0–8.3)

## 2012-01-04 LAB — BASIC METABOLIC PANEL
BUN: 13 mg/dL (ref 6–23)
CO2: 31 mEq/L (ref 19–32)
Calcium: 9.4 mg/dL (ref 8.4–10.5)
Chloride: 98 mEq/L (ref 96–112)
Creatinine, Ser: 1.2 mg/dL (ref 0.4–1.5)
GFR: 69.55 mL/min (ref >60.00)
Glucose, Bld: 101 mg/dL — ABNORMAL HIGH (ref 70–99)
Potassium: 4.4 mEq/L (ref 3.5–5.1)
Sodium: 138 mEq/L (ref 135–145)

## 2012-01-04 LAB — CBC WITH DIFFERENTIAL/PLATELET
Basophils Absolute: 0 10*3/uL (ref 0.0–0.1)
Basophils Relative: 0.5 % (ref 0.0–3.0)
Eosinophils Absolute: 0.2 10*3/uL (ref 0.0–0.7)
Eosinophils Relative: 3.4 % (ref 0.0–5.0)
HCT: 44.8 % (ref 39.0–52.0)
Hemoglobin: 15.1 g/dL (ref 13.0–17.0)
Lymphocytes Relative: 25.8 % (ref 12.0–46.0)
Lymphs Abs: 1.3 10*3/uL (ref 0.7–4.0)
MCHC: 33.8 g/dL (ref 30.0–36.0)
MCV: 82.5 fl (ref 78.0–100.0)
Monocytes Absolute: 0.5 10*3/uL (ref 0.1–1.0)
Monocytes Relative: 10.7 % (ref 3.0–12.0)
Neutro Abs: 2.9 10*3/uL (ref 1.4–7.7)
Neutrophils Relative %: 59.6 % (ref 43.0–77.0)
Platelets: 212 10*3/uL (ref 150.0–400.0)
RBC: 5.43 Mil/uL (ref 4.22–5.81)
RDW: 14.6 % (ref 11.5–14.6)
WBC: 4.9 10*3/uL (ref 4.5–10.5)

## 2012-01-04 LAB — LIPASE: Lipase: 32 U/L (ref 11.0–59.0)

## 2012-01-04 MED ORDER — CIPROFLOXACIN HCL 750 MG PO TABS: 750.0000 mg | ORAL_TABLET | Freq: Two times a day (BID) | ORAL | Status: AC

## 2012-01-04 MED ORDER — METRONIDAZOLE 500 MG PO TABS: 500.0000 mg | ORAL_TABLET | Freq: Three times a day (TID) | ORAL | Status: AC

## 2012-01-04 NOTE — Progress Notes (Signed)
  Patient Name: Jeffrey Barry Date of Birth: 04-Sep-1961 Age: 51 y.o. Medical Record Number: [id number] Gender: male Date of Encounter: 01/04/2012  History of Present Illness:  Jeffrey Barry is a 51 y.o. very pleasant male patient who presents with the following:  ABd pain over the weekend. What's coming out is greenish in coloration. When laying down has the worse. Hard to walk. No fever. BP up some. No prior abdomen surgery. Decreased eating. Yesterday for lunch, a little bit of bland food. Some mild constipation. H/o diverticulitis. Pain more in lower abdomen.  Past Medical History, Surgical History, Social History, Family History, Problem List, Medications, and Allergies have been reviewed and updated if relevant.  Review of Systems: ROS: GEN: Acute illness details above GI: Tolerating PO intake - decreased GU: maintaining adequate hydration and urination Pulm: No SOB Interactive and getting along well at home.  Otherwise, ROS is as per the HPI.   Physical Examination: Filed Vitals:   01/04/12 0914  BP: 140/90  Pulse: 60  Temp: 98.6 F (37 C)  TempSrc: Oral  Height: 5\' 9"  (1.753 m)  Weight: 205 lb 8 oz (93.214 kg)    Body mass index is 30.35 kg/(m^2).   GEN: WDWN, NAD, Non-toxic, A & O x 3 HEENT: Atraumatic, Normocephalic. Neck supple. No masses, No LAD. Ears and Nose: No external deformity. CV: RRR, No M/G/R. No JVD. No thrill. No extra heart sounds. PULM: CTA B, no wheezes, crackles, rhonchi. No retractions. No resp. distress. No accessory muscle use. ABD: mild L > R lower abdominal pain, no rebound, no masses EXTR: No c/c/e NEURO Normal gait.  PSYCH: Normally interactive. Conversant. Not depressed or anxious appearing.  Calm demeanor.    Assessment and Plan: 1. Lower abdominal pain  ciprofloxacin (CIPRO) 750 MG tablet, metroNIDAZOLE (FLAGYL) 500 MG tablet, Basic metabolic panel, CBC with Differential, Hepatic function panel, Lipase  2. Diverticulitis   ciprofloxacin (CIPRO) 750 MG tablet, metroNIDAZOLE (FLAGYL) 500 MG tablet, Basic metabolic panel, CBC with Differential, Hepatic function panel, Lipase    Probable diverticulitiis Check basic labs to check, ensure not liver, pancreas  Recheck 3-4 days if not significantly better

## 2012-09-14 ENCOUNTER — Telehealth: Payer: Self-pay | Admitting: Family Medicine

## 2012-09-14 NOTE — Telephone Encounter (Signed)
Agree - I don't think this can wait.   Hannah Beat, MD 09/14/2012, 1:11 PM

## 2012-09-14 NOTE — Telephone Encounter (Signed)
Caller: [place]/Patient; Patient Name: Jeffrey Barry; PCP: Hannah Beat [facility]); Best Callback Phone Number: [phone]. Call regarding diverticulitis. Onset 09/13/12. Reports abdominal pain in epigastric area which is worse when lying down. Denies vomiting. Last bowel movement was 09/13/12. Reports feeling constipated at this time. Patient is currently out of town and will be returning home late in the evening on 09/15/12. Reports that the pain gets worse if he moves certain ways. Emergent symptom of "Generalized or localized pain made worse with cough, movement, or standing up straight" positive per Abdominal Pain guideline. Disposition: See ED Immediately. Instructed patient to go to Urgent Care/ED nearby in [place]. Care advice and call back parameters given per guideline. Patient verbalized understanding.

## 2012-10-03 ENCOUNTER — Encounter: Payer: Self-pay | Admitting: Family Medicine

## 2012-10-03 ENCOUNTER — Ambulatory Visit (INDEPENDENT_AMBULATORY_CARE_PROVIDER_SITE_OTHER): Payer: Managed Care, Other (non HMO) | Admitting: Family Medicine

## 2012-10-03 VITALS — BP 138/84 | HR 96 | Temp 98.2°F | Wt 201.5 lb

## 2012-10-03 DIAGNOSIS — R109 Unspecified abdominal pain: Secondary | ICD-10-CM

## 2012-10-03 MED ORDER — AMOXICILLIN-POT CLAVULANATE 875-125 MG PO TABS: 1.0000 | ORAL_TABLET | Freq: Two times a day (BID) | ORAL | Status: AC

## 2012-10-03 MED ORDER — HYDROCHLOROTHIAZIDE 12.5 MG PO CAPS: 12.5000 mg | ORAL_CAPSULE | Freq: Every day | ORAL | Status: DC

## 2012-10-03 NOTE — Patient Instructions (Signed)
I wonder if bowels need more time to recover from recent possible viral gastroenteritis. Give a few more days - bland diet, avoid too high fiber diet for next few days, may take soluble fiber supplements like metamucil or cirtrucel. Antibiotic to hold on to in case fever or bowel changes persist. If not improving as expected despite antibiotic, please return to see [place] after your trip. Good to see you today, call us with questions.

## 2012-10-03 NOTE — Assessment & Plan Note (Signed)
After possible recent viral gastroenteritis, could be residual bowel lining irritation from this. However does have history of diverticulosis of colon seen at last colonoscopy 2011. Recommend low fiber bland diet for next several days, then slowly advance diet. If not improving or any worsening or fever, fill 10d augmentin course to cover possible early diverticulitis. If not improving despite this, return for further evaluation, blood work, Catering manager.

## 2012-10-03 NOTE — Progress Notes (Signed)
  Subjective:    Patient ID: Jeffrey Barry, male    DOB: 05-Jul-1961, 51 y.o.   MRN: [id number]  HPI CC: ?diverticulitis   Got sick last Thursday night (5d ago)- vomiting, diarrhea.  Next day had some diarrhea but started feeling better.  Then started getting more constipated.  Took dulcolax which has improved sxs.  Starting to have green stools.  Also with peripheral abd discomfort described as dull ache.  Last night across upper abdomen, today LLQ.  Had chills Friday morning, none since.  Friday had clear liquid diet.  Normally - 3-4 soft stools every am.  Consistent with water intake - 8 glasses water.  Consistent with fiber intake.  Last BM 10am this morning.  Passing gas fine.  Appetite down.  No fevers.  Currently no nausea/vomiting, diarrhea.  No blood in stool or mucous in stool.  Denies indigestion.  No GERD sxs if avoids deep fried foods.  Early Oct had eval at [facility] with dulcolax and MOM.    Last seen here 12/2011 with lower abd pain and similar sxs - treated for presumed diverticulitis with cipro/flagyl.  CMP,CBC WNL at that time.  Wt Readings from Last 3 Encounters:  10/03/12 201 lb 8 oz (91.4 kg)  01/04/12 205 lb 8 oz (93.214 kg)  06/15/11 205 lb 12.8 oz (93.35 kg)    Past Medical History  Diagnosis Date  . CTS (carpal tunnel syndrome)   . OSA (obstructive sleep apnea)     Mild  . Diverticulitis   . Hypertension 06/15/2011  . Allergic reaction to inhaled pollen 06/15/2011    Colonoscopy 2011 - moderate diverticulosis in sigmoid and descending colon.  No polyps.  rec rpt 10 yrs.  Review of Systems Per HPI    Objective:   Physical Exam  Nursing note and vitals reviewed. Constitutional: He appears well-developed and well-nourished. No distress.  HENT:  Head: Normocephalic and atraumatic.  Mouth/Throat: Oropharynx is clear and moist. No oropharyngeal exudate.  Cardiovascular: Normal rate, regular rhythm, normal heart sounds and intact distal pulses.   No murmur  heard. Pulmonary/Chest: Effort normal and breath sounds normal. No respiratory distress. He has no wheezes. He has no rales.  Abdominal: Soft. Normal appearance and bowel sounds are normal. He exhibits no distension and no mass. There is no hepatosplenomegaly. There is tenderness (mild) in the right lower quadrant, epigastric area and left lower quadrant. There is no rigidity, no rebound, no guarding, no CVA tenderness and negative Murphy's sign.  Musculoskeletal: He exhibits no edema.  Skin: Skin is warm and dry. No rash noted.  Psychiatric: He has a normal mood and affect.       Assessment & Plan:

## 2013-03-28 ENCOUNTER — Telehealth: Payer: Self-pay | Admitting: Family Medicine

## 2013-03-28 NOTE — Telephone Encounter (Signed)
Ok by me

## 2013-03-28 NOTE — Telephone Encounter (Signed)
Pt wants to schedule a CPE, but first wants to switch his PCP to Dr. Sharen Hones.  Is this ok w/both of you? Thank you.

## 2013-03-28 NOTE — Telephone Encounter (Signed)
Fine with me - i would not anticipate any particular problem.  Hannah Beat, MD 03/28/2013, 10:25 AM

## 2013-03-29 NOTE — Telephone Encounter (Signed)
I notified patient and he scheduled a cpx on 04/20/13 and lab work on 04/13/13.  I changed the pcp in Epic to Dr.Gutierrez.

## 2013-04-05 ENCOUNTER — Other Ambulatory Visit: Payer: Self-pay | Admitting: Family Medicine

## 2013-04-05 DIAGNOSIS — I10 Essential (primary) hypertension: Secondary | ICD-10-CM

## 2013-04-06 ENCOUNTER — Encounter: Payer: Self-pay | Admitting: Family Medicine

## 2013-04-13 ENCOUNTER — Other Ambulatory Visit (INDEPENDENT_AMBULATORY_CARE_PROVIDER_SITE_OTHER): Payer: Managed Care, Other (non HMO)

## 2013-04-13 DIAGNOSIS — Z0000 Encounter for general adult medical examination without abnormal findings: Secondary | ICD-10-CM

## 2013-04-13 DIAGNOSIS — I10 Essential (primary) hypertension: Secondary | ICD-10-CM

## 2013-04-13 LAB — LIPID PANEL
Cholesterol: 173 mg/dL (ref 0–200)
HDL: 33.7 mg/dL — ABNORMAL LOW (ref >39.00)
LDL Cholesterol: 123 mg/dL — ABNORMAL HIGH (ref 0–99)
Total CHOL/HDL Ratio: 5
Triglycerides: 84 mg/dL (ref 0.0–149.0)
VLDL: 16.8 mg/dL (ref 0.0–40.0)

## 2013-04-13 LAB — BASIC METABOLIC PANEL
BUN: 13 mg/dL (ref 6–23)
CO2: 30 mEq/L (ref 19–32)
Calcium: 9.3 mg/dL (ref 8.4–10.5)
Chloride: 99 mEq/L (ref 96–112)
Creatinine, Ser: 1 mg/dL (ref 0.4–1.5)
GFR: 80.05 mL/min (ref >60.00)
Glucose, Bld: 87 mg/dL (ref 70–99)
Potassium: 3.8 mEq/L (ref 3.5–5.1)
Sodium: 134 mEq/L — ABNORMAL LOW (ref 135–145)

## 2013-04-20 ENCOUNTER — Ambulatory Visit (INDEPENDENT_AMBULATORY_CARE_PROVIDER_SITE_OTHER): Payer: Managed Care, Other (non HMO) | Admitting: Family Medicine

## 2013-04-20 ENCOUNTER — Encounter: Payer: Self-pay | Admitting: Family Medicine

## 2013-04-20 ENCOUNTER — Other Ambulatory Visit: Payer: Self-pay | Admitting: *Deleted

## 2013-04-20 VITALS — BP 114/70 | HR 80 | Temp 98.2°F | Ht 69.0 in | Wt 172.0 lb

## 2013-04-20 DIAGNOSIS — Z0000 Encounter for general adult medical examination without abnormal findings: Secondary | ICD-10-CM | POA: Insufficient documentation

## 2013-04-20 DIAGNOSIS — IMO0001 Reserved for inherently not codable concepts without codable children: Secondary | ICD-10-CM

## 2013-04-20 DIAGNOSIS — Z125 Encounter for screening for malignant neoplasm of prostate: Secondary | ICD-10-CM

## 2013-04-20 DIAGNOSIS — E785 Hyperlipidemia, unspecified: Secondary | ICD-10-CM | POA: Insufficient documentation

## 2013-04-20 DIAGNOSIS — I10 Essential (primary) hypertension: Secondary | ICD-10-CM

## 2013-04-20 DIAGNOSIS — Z9189 Other specified personal risk factors, not elsewhere classified: Secondary | ICD-10-CM

## 2013-04-20 LAB — PSA: PSA: 4.25 ng/mL — ABNORMAL HIGH (ref 0.10–4.00)

## 2013-04-20 MED ORDER — FLUTICASONE PROPIONATE 50 MCG/ACT NA SUSP: 2.0000 | Freq: Every day | NASAL | Status: DC | PRN

## 2013-04-20 NOTE — Assessment & Plan Note (Signed)
Significant improvement noted with weight loss.  Ok to stop HCTZ.

## 2013-04-20 NOTE — Assessment & Plan Note (Addendum)
Preventative protocols reviewed and updated unless pt declined. Discussed healthy diet and lifestyle.  Discussed hepatitis A/B for upcoming trip - pt will notify me if desires immunizations. Check Hep Bs Ab today Congratulated on healthy diet changes and weight loss. Check PSA today.

## 2013-04-20 NOTE — Progress Notes (Signed)
Subjective:    Patient ID: Jeffrey Barry, male    DOB: [birth year], 52 y.o.   MRN: [id number]  HPI CC: CPE  Needs CPE for mission trip this summer - to [place]  Has not received hepatitis panel in past.  Trying to lose weight - doing [employer] - gluten free diet.  Trying to stay on diet. Notes improved joint pain. Wt Readings from Last 3 Encounters:  04/20/13 172 lb (78.019 kg)  10/03/12 201 lb 8 oz (91.4 kg)  01/04/12 205 lb 8 oz (93.214 kg)  Body mass index is 25.39 kg/(m^2).   Has been tracking weight and blood pressure - all readings <140/90, most 110 systolic.  Wonders about need for blood pressure.  Preventative: Colonoscopy 08/2010 - mod diverticulosis, rec rpt 5 yrs Jarold Motto) Prostate cancer screening - discussed.  Would like DRE today, will obtain PSA next year. Td 2006 Flu shot - does not receive.  Caffeine: rare Married-2 children Lives with wife, no pets Occupation: Armed forces training and education officer, works from home Activity: spin bike, walks and [employer] Diet: good water, fruits/vegetables daily  Sunscreen use and seat belt use discussed  Medications and allergies reviewed and updated in chart.  Past histories reviewed and updated if relevant as below. Patient Active Problem List   Diagnosis Date Noted  . Abdominal  pain, other specified site 10/03/2012  . Hypertension 06/15/2011  . Allergic reaction to inhaled pollen 06/15/2011  . DIVERTICULOSIS-COLON 08/12/2010  . DIVERTICULITIS, ACUTE 12/30/2009   Past Medical History  Diagnosis Date  . CTS (carpal tunnel syndrome)   . OSA (obstructive sleep apnea)     Mild  . Diverticulitis   . Hypertension 06/15/2011  . Allergic reaction to inhaled pollen 06/15/2011   Past Surgical History  Procedure Laterality Date  . Knee arthroscopy      bilateral (meniscus)  . Colonoscopy  08/2010    mod diverticulosis, rec rpt 10 yrs Jarold Motto)   History  Substance Use Topics  . Smoking status: Never Smoker   . Smokeless tobacco:  Never Used  . Alcohol Use: Yes     Comment: glass of red wine nightly (~3nights/weekly)   Family History  Problem Relation Age of Onset  . Heart disease Maternal Grandfather   . Kidney disease Paternal Grandfather   . Colon polyps Father 50    had 7 inches of colon removed; many polyps, noncancerous mass removed  . Cancer Maternal Grandmother     breast  . Diabetes Neg Hx   . CAD Neg Hx   . Stroke Neg Hx    Allergies  Allergen Reactions  . Antihistamines, Loratadine-Type Other (See Comments)    BP spikes  . Lorabid (Loracarbef) Hives   Current Outpatient Prescriptions on File Prior to Visit  Medication Sig Dispense Refill  . Ascorbic Acid (VITAMIN C) 1000 MG tablet Take 1,000 mg by mouth daily.        . fluticasone (FLONASE) 50 MCG/ACT nasal spray Place 2 sprays into the nose daily.  3 Act  3  . glucosamine-chondroitin 500-400 MG tablet Take 1 tablet by mouth daily.        . GUAIFENESIN PO Take by mouth as needed.        . hydrochlorothiazide (MICROZIDE) 12.5 MG capsule Take 1 capsule (12.5 mg total) by mouth daily.  90 capsule  1  . Multiple Vitamin (MULTIVITAMIN) tablet Take 1 tablet by mouth daily.        . Vitamins-Lipotropics (B COMPLEX FORMULA 1) TABS Take  1 tablet by mouth daily.        . hydrocortisone-pramoxine (ANALPRAM-HC) 2.5-1 % rectal cream Place 1 application rectally 3 (three) times daily.        Marland Kitchen ketoconazole (NIZORAL) 2 % cream Apply 1 application topically 2 (two) times daily.         No current facility-administered medications on file prior to visit.      Review of Systems  Constitutional: Negative for fever, chills, activity change, appetite change, fatigue and unexpected weight change.  HENT: Negative for hearing loss and neck pain.   Eyes: Negative for visual disturbance.  Respiratory: Negative for cough, chest tightness, shortness of breath and wheezing.   Cardiovascular: Negative for chest pain, palpitations and leg swelling.  Gastrointestinal:  Positive for constipation. Negative for nausea, vomiting, abdominal pain, diarrhea, blood in stool and abdominal distention.  Genitourinary: Negative for hematuria and difficulty urinating.  Musculoskeletal: Negative for myalgias and arthralgias.  Skin: Negative for rash.  Neurological: Negative for dizziness, seizures, syncope and headaches.  Hematological: Negative for adenopathy. Does not bruise/bleed easily.  Psychiatric/Behavioral: Negative for dysphoric mood. The patient is not nervous/anxious.        Objective:   Physical Exam  Nursing note and vitals reviewed. Constitutional: He is oriented to person, place, and time. He appears well-developed and well-nourished. No distress.  HENT:  Head: Normocephalic and atraumatic.  Right Ear: Hearing, tympanic membrane, external ear and ear canal normal.  Left Ear: Hearing, tympanic membrane, external ear and ear canal normal.  Nose: Nose normal.  Mouth/Throat: Oropharynx is clear and moist. No oropharyngeal exudate.  Eyes: Conjunctivae and EOM are normal. Pupils are equal, round, and reactive to light. No scleral icterus.  Neck: Normal range of motion. Neck supple. No thyromegaly present.  Cardiovascular: Normal rate, regular rhythm, normal heart sounds and intact distal pulses.   No murmur heard. Pulses:      Radial pulses are 2+ on the right side, and 2+ on the left side.  Pulmonary/Chest: Effort normal and breath sounds normal. No respiratory distress. He has no wheezes. He has no rales.  Abdominal: Soft. Bowel sounds are normal. He exhibits no distension and no mass. There is no tenderness. There is no rebound and no guarding.  Genitourinary: Rectum normal. Rectal exam shows no external hemorrhoid, no internal hemorrhoid, no fissure, no mass, no tenderness and anal tone normal. Prostate is enlarged (slightly, 20gm). Prostate is not tender.  Musculoskeletal: Normal range of motion. He exhibits no edema.  Lymphadenopathy:    He has no  cervical adenopathy.  Neurological: He is alert and oriented to person, place, and time.  CN grossly intact, station and gait intact  Skin: Skin is warm and dry. No rash noted.  Psychiatric: He has a normal mood and affect. His behavior is normal. Judgment and thought content normal.       Assessment & Plan:

## 2013-04-20 NOTE — Patient Instructions (Signed)
With the diet change and the weight loss, we don't need blood pressure medicine anymore.  May stop.  But keep an eye on blood pressures. Blood work today to check prostate and hep B immunity. Good to see you, call us with questions. Great job with healthy lifestyle changes and weight loss!

## 2013-04-21 LAB — HEPATITIS B SURFACE ANTIBODY,QUALITATIVE: Hep B S Ab: NONREACTIVE

## 2013-04-23 ENCOUNTER — Other Ambulatory Visit: Payer: Self-pay | Admitting: Family Medicine

## 2013-04-23 DIAGNOSIS — R972 Elevated prostate specific antigen [PSA]: Secondary | ICD-10-CM

## 2013-08-01 ENCOUNTER — Other Ambulatory Visit (INDEPENDENT_AMBULATORY_CARE_PROVIDER_SITE_OTHER): Payer: Managed Care, Other (non HMO)

## 2013-08-01 DIAGNOSIS — R972 Elevated prostate specific antigen [PSA]: Secondary | ICD-10-CM

## 2013-08-02 LAB — PSA, TOTAL AND FREE
PSA, Free Pct: 25 % (ref >25)
PSA, Free: 0.77 ng/mL
PSA: 3.11 ng/mL (ref <=4.00)

## 2013-08-16 ENCOUNTER — Telehealth: Payer: Self-pay | Admitting: Family Medicine

## 2013-08-16 NOTE — Telephone Encounter (Signed)
Form in your IN box for completion. 

## 2013-08-16 NOTE — Telephone Encounter (Signed)
Jeffrey Barry"s wife Jeffrey Barry dropped off a health screen form to be filled out.

## 2013-08-17 NOTE — Telephone Encounter (Signed)
Signed and placed in Kim's box.  plz notify ready to pick up

## 2013-08-17 NOTE — Telephone Encounter (Signed)
Message left advising patient's wife form was ready. Placed up front for pick up.

## 2013-10-19 ENCOUNTER — Other Ambulatory Visit: Payer: Self-pay

## 2014-01-17 ENCOUNTER — Encounter: Payer: Self-pay | Admitting: Cardiovascular Disease

## 2014-01-17 ENCOUNTER — Encounter: Payer: Self-pay | Admitting: Pulmonary Disease

## 2014-09-28 ENCOUNTER — Other Ambulatory Visit: Payer: Self-pay

## 2015-04-16 ENCOUNTER — Encounter: Payer: Self-pay | Admitting: Family Medicine

## 2015-04-16 ENCOUNTER — Ambulatory Visit (INDEPENDENT_AMBULATORY_CARE_PROVIDER_SITE_OTHER): Payer: BLUE CROSS/BLUE SHIELD | Admitting: Family Medicine

## 2015-04-16 VITALS — BP 130/80 | HR 88 | Temp 98.1°F | Wt 164.5 lb

## 2015-04-16 DIAGNOSIS — R899 Unspecified abnormal finding in specimens from other organs, systems and tissues: Secondary | ICD-10-CM | POA: Diagnosis not present

## 2015-04-16 DIAGNOSIS — E786 Lipoprotein deficiency: Secondary | ICD-10-CM

## 2015-04-16 NOTE — Assessment & Plan Note (Addendum)
Positive Hep B c Ab but negative HepBsAg and negative HBV PCR - possible false positive or possibly naturally immune after previous infection. Denies sxs consistent with acute hepatitis. Recheck labs today then f/u with be determined based on results.

## 2015-04-16 NOTE — Progress Notes (Signed)
   BP 130/80 mmHg  Pulse 88  Temp(Src) 98.1 F (36.7 C) (Oral)  Wt 164 lb 8 oz (74.617 kg)   CC: discuss labs  Subjective:    Patient ID: Jeffrey Barry, male    DOB: 06/17/1961, 54 y.o.   MRN: [id number]  HPI: Jeffrey Barry is a 54 y.o. male presenting on 04/16/2015 for Follow-up   Recent blood donation - found to have + hep B core antibody but negative HBsAg and negative HBV DNA.  Denies h/o blood transfusion, h/o IVDU. No surgeries in the past.  No tattoos.  He donates blood every few months regularly for last few years. Has had testing done in the past and never abnormal. Reviewed labs he brings.  Relevant past medical, surgical, family and social history reviewed and updated as indicated. Interim medical history since our last visit reviewed. Allergies and medications reviewed and updated. Current Outpatient Prescriptions on File Prior to Visit  Medication Sig  . Ascorbic Acid (VITAMIN C) 1000 MG tablet Take 1,000 mg by mouth daily.    . fluticasone (FLONASE) 50 MCG/ACT nasal spray Place 2 sprays into the nose daily as needed.  Marland Kitchen. glucosamine-chondroitin 500-400 MG tablet Take 1 tablet by mouth daily.    . GUAIFENESIN PO Take by mouth as needed.    . Multiple Vitamin (MULTIVITAMIN) tablet Take 1 tablet by mouth daily.    . Vitamins-Lipotropics (B COMPLEX FORMULA 1) TABS Take 1 tablet by mouth daily.     No current facility-administered medications on file prior to visit.    Review of Systems Per HPI unless specifically indicated above     Objective:    BP 130/80 mmHg  Pulse 88  Temp(Src) 98.1 F (36.7 C) (Oral)  Wt 164 lb 8 oz (74.617 kg)  Wt Readings from Last 3 Encounters:  04/16/15 164 lb 8 oz (74.617 kg)  04/20/13 172 lb (78.019 kg)  10/03/12 201 lb 8 oz (91.4 kg)    Physical Exam  Constitutional: He appears well-developed and well-nourished. No distress.  HENT:  Mouth/Throat: Oropharynx is clear and moist. No oropharyngeal exudate.  Cardiovascular:  Normal rate, regular rhythm, normal heart sounds and intact distal pulses.   No murmur heard. Pulmonary/Chest: Effort normal and breath sounds normal. No respiratory distress. He has no wheezes. He has no rales.  Abdominal: Soft. Bowel sounds are normal. He exhibits no distension and no mass. There is no hepatosplenomegaly. There is no tenderness. There is no rebound and no guarding.  Musculoskeletal: He exhibits no edema.  Skin: Skin is warm and dry. No rash noted.  Psychiatric: He has a normal mood and affect.  Nursing note and vitals reviewed.     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 Denies sxs consistent with acute hepatitis. Recheck labs today then f/u with be determined based on results.      Relevant Orders   Comprehensive metabolic panel   CBC with Differential/Platelet   Hepatitis panel, acute       Follow up plan: Return if symptoms worsen or fail to improve.

## 2015-04-16 NOTE — Addendum Note (Signed)
Addended by: Liane ComberHAVERS, NATASHA C on: 04/16/2015 09:30 AM   Modules accepted: Orders

## 2015-04-16 NOTE — Progress Notes (Signed)
Pre visit review using our clinic review tool, if applicable. No additional management support is needed unless otherwise documented below in the visit note. 

## 2015-04-16 NOTE — Patient Instructions (Addendum)
Blood work today - we will call you with results.  Hepatitis B Hepatitis B is a viral infection of the liver. Over half the people who become infected with hepatitis B never feel sick. However, some may later develop long-term liver disease (chronic hepatitis). There are 2 phases of the disease: sudden (acute) and longstanding (chronic). CAUSES Hepatitis B is caused by the hepatitis B virus (HBV). It can enter the body by sharing needles contaminated with blood from an infected person, by sharing intimate items such as toothbrushes and razors, or by sex with an infected person. A baby can get HBV from its birth mother. A caregiver may also get it from exposure to the blood of an infected patient by way of a cut or needle stick.  SYMPTOMS Acute Phase Many cases of acute HBV infection are mild and cause few problems.Some people may not even realize they are sick.Symptoms in others may last a few weeks to several months and include:  Loss of appetite.  Feeling very tired.  Nausea.  Vomiting.  Abdominal pain.  Dark yellow urine.  Yellow skin and eyes (jaundice). Chronic Phase  About 5% of people who get HBV infection become "chronic carriers." They often have no symptoms, but the virus stays in their body. They may spread the virus to others and can get long-term liver disease. The younger a child is when the infection starts, the more likely that child will be a carrier.  About 25% of chronic HBV carriers get a disease called "chronic active hepatitis." These people may develop scarring of the liver (cirrhosis), liver failure, or liver cancer. DIAGNOSIS Your caregiver can do a blood test to see if you have the disease. TREATMENT Acute hepatitis B does not usually require any drug treatment. It is important to avoid medicines such as acetaminophen that may cause increasing liver damage.  Treatment with many antiviral drugs is available and recommended for some patients with hepatitis B  infection. The goal is to reduce the risk of progressive chronic liver disease, transmission of infection to others, and other long-term complications such as cirrhosis, liver failure, and liver cancer. Drug treatment is often advised for people with:  Acute liver failure.  Clinical complications of cirrhosis.  Cirrhosis or advanced fibrosis with high serum measurements of viral DNA.  Reactivation of chronic HBV after chemotherapy or immunosuppression. Immediate drug treatment is not often advised for patients who have chronic infection but normal liver enzyme tests or patients who have a positive hepatitis B DNA test in blood but no other signs of active infection.Patients may have other circumstances that suggest a need or potential benefit from drug treatment. Successful treatment currently requires taking treatment drugs over a long period of time. An injected drug (interferon) may be given daily, 3 times a week, or once weekly for up to 1 year. An oral drug treatment plan may require daily dosing for many years or indefinitely, in order to prevent infection reactivation and worsening of liver disease. Side effects from these drugs are common and some may be very serious. Your response to treatment must be carefully monitored by both you and your caregiver throughout the entire treatment period. PREVENTION Hepatitis B vaccine is highly effective in preventing a hepatitis B infection.The vaccine is recommended worldwide for all newborns of hepatitis B infected mothers, and in many countries for all newborns. Hepatitis B vaccine is also recommended in the [place] for other people at higher than normal risk of getting an infection, including:  Sexually  active people with multiple sex partners.  Homosexual and bisexual men.  People who live with someone who has hepatitis B.  Injection drug users.  Healthcare workers.  Patients on chronic hemodialysis and patients who need repeated blood or  blood product transfusions.  Patients with chronic liver disease due to any cause.  Unvaccinated people traveling to areas with high levels of local HBV infection.  Patients with diabetes. Hepatitis B immune globulin (HBIG) is often given with hepatitis B vaccine to people who have been exposed to blood contaminated with HBV. The HBIG protects you from the virus for the first 1 to 3 months. After that, the hepatitis B vaccine takes over and gives you long-term protection. Your caregiver will help you decide whether and when to get these shots following exposure to HBV. Healthcare workers need to avoid injuries and wear appropriate protective equipment such as gloves, gowns, and face masks when performing invasive medical or nursing procedures.  HOME CARE INSTRUCTIONS   Rest when you feel tired, and eat when you are hungry.  Avoid a sexual relationship until advised otherwise by your caregiver.  Avoid activities that could expose other people to your blood. Examples include sharing a toothbrush, nail clippers, razors, and needles.  This infection is contagious. Follow your caregiver's instructions in order to avoid spread of the infection.  Do not take any medicines until your caregiver says it is okay. This includes over-the-counter drugs such as acetominophen that are usually taken for fever or pain. SEEK IMMEDIATE MEDICAL CARE IF:   You are unable to eat or drink.  You feel sick to your stomach (nauseous) or throw up (vomit).  You feel confused.  Jaundice becomes more severe.  You have trouble breathing, a rash, or swelling of the skin, throat, mouth, or face. You may be having an allergic reaction to the medicine in the shot.  You start twitching or shaking (seizure).  You become very sleepy or have trouble waking up. MAKE SURE YOU:   Understand these instructions.  Will watch your condition.  Will get help right away if you are not doing well or get worse. Document  Released: 11/27/2000 Document Revised: 02/22/2012 Document Reviewed: 03/09/2014 [facility] Patient Information 2015 [place], [place]. This information is not intended to replace advice given to you by your health care provider. Make sure you discuss any questions you have with your health care provider.

## 2015-04-17 LAB — COMPREHENSIVE METABOLIC PANEL
ALT: 15 U/L (ref 0–53)
AST: 17 U/L (ref 0–37)
Albumin: 4.6 g/dL (ref 3.5–5.2)
Alkaline Phosphatase: 63 U/L (ref 39–117)
BUN: 12 mg/dL (ref 6–23)
CO2: 28 mEq/L (ref 19–32)
Calcium: 9.6 mg/dL (ref 8.4–10.5)
Chloride: 101 mEq/L (ref 96–112)
Creat: 0.89 mg/dL (ref 0.50–1.35)
Glucose, Bld: 89 mg/dL (ref 70–99)
Potassium: 4.9 mEq/L (ref 3.5–5.3)
Sodium: 138 mEq/L (ref 135–145)
Total Bilirubin: 0.6 mg/dL (ref 0.2–1.2)
Total Protein: 7.3 g/dL (ref 6.0–8.3)

## 2015-04-17 LAB — HEPATITIS PANEL, ACUTE
HCV Ab: NEGATIVE
Hep A IgM: NONREACTIVE
Hep B C IgM: NONREACTIVE
Hepatitis B Surface Ag: NEGATIVE

## 2015-04-17 LAB — CBC WITH DIFFERENTIAL/PLATELET
Basophils Absolute: 0 10*3/uL (ref 0.0–0.1)
Basophils Relative: 1 % (ref 0–1)
Eosinophils Absolute: 0.2 10*3/uL (ref 0.0–0.7)
Eosinophils Relative: 6 % — ABNORMAL HIGH (ref 0–5)
HCT: 38.3 % — ABNORMAL LOW (ref 39.0–52.0)
Hemoglobin: 12.7 g/dL — ABNORMAL LOW (ref 13.0–17.0)
Lymphocytes Relative: 32 % (ref 12–46)
Lymphs Abs: 1.1 10*3/uL (ref 0.7–4.0)
MCH: 26.5 pg (ref 26.0–34.0)
MCHC: 33.2 g/dL (ref 30.0–36.0)
MCV: 80 fL (ref 78.0–100.0)
MPV: 9.7 fL (ref 8.6–12.4)
Monocytes Absolute: 0.5 10*3/uL (ref 0.1–1.0)
Monocytes Relative: 14 % — ABNORMAL HIGH (ref 3–12)
Neutro Abs: 1.6 10*3/uL — ABNORMAL LOW (ref 1.7–7.7)
Neutrophils Relative %: 47 % (ref 43–77)
Platelets: 243 10*3/uL (ref 150–400)
RBC: 4.79 MIL/uL (ref 4.22–5.81)
RDW: 14.5 % (ref 11.5–15.5)
WBC: 3.5 10*3/uL — ABNORMAL LOW (ref 4.0–10.5)

## 2015-04-17 LAB — LIPID PANEL
Cholesterol: 204 mg/dL — ABNORMAL HIGH (ref 0–200)
HDL: 38 mg/dL — ABNORMAL LOW (ref >=40)
LDL Cholesterol: 150 mg/dL — ABNORMAL HIGH (ref 0–99)
Total CHOL/HDL Ratio: 5.4 Ratio
Triglycerides: 78 mg/dL (ref <150)
VLDL: 16 mg/dL (ref 0–40)

## 2015-04-21 ENCOUNTER — Encounter: Payer: Self-pay | Admitting: Family Medicine

## 2015-04-21 ENCOUNTER — Other Ambulatory Visit: Payer: Self-pay | Admitting: Family Medicine

## 2015-04-21 DIAGNOSIS — R768 Other specified abnormal immunological findings in serum: Secondary | ICD-10-CM

## 2015-04-21 DIAGNOSIS — D72819 Decreased white blood cell count, unspecified: Secondary | ICD-10-CM

## 2015-04-26 ENCOUNTER — Other Ambulatory Visit (INDEPENDENT_AMBULATORY_CARE_PROVIDER_SITE_OTHER): Payer: BLUE CROSS/BLUE SHIELD

## 2015-04-26 DIAGNOSIS — D72819 Decreased white blood cell count, unspecified: Secondary | ICD-10-CM | POA: Diagnosis not present

## 2015-04-26 DIAGNOSIS — R768 Other specified abnormal immunological findings in serum: Secondary | ICD-10-CM

## 2015-04-26 LAB — FOLATE: Folate: 15.5 ng/mL

## 2015-04-26 LAB — VITAMIN B12: Vitamin B-12: 795 pg/mL (ref 211–911)

## 2015-04-27 LAB — HEPATITIS B SURFACE ANTIBODY,QUALITATIVE: Hep B S Ab: NEGATIVE

## 2015-04-27 LAB — HEPATITIS B CORE ANTIBODY, TOTAL: Hep B Core Total Ab: NONREACTIVE

## 2015-04-29 LAB — HEPATITIS B E ANTIBODY: Hepatitis Be Antibody: NONREACTIVE

## 2015-04-29 LAB — PATHOLOGIST SMEAR REVIEW

## 2015-05-13 ENCOUNTER — Other Ambulatory Visit: Payer: Self-pay | Admitting: Family Medicine

## 2015-05-13 DIAGNOSIS — D649 Anemia, unspecified: Secondary | ICD-10-CM

## 2015-07-25 ENCOUNTER — Encounter: Payer: Self-pay | Admitting: Gastroenterology

## 2015-08-22 ENCOUNTER — Other Ambulatory Visit (INDEPENDENT_AMBULATORY_CARE_PROVIDER_SITE_OTHER): Payer: BLUE CROSS/BLUE SHIELD

## 2015-08-22 DIAGNOSIS — D649 Anemia, unspecified: Secondary | ICD-10-CM

## 2015-08-22 LAB — CBC WITH DIFFERENTIAL/PLATELET
Basophils Absolute: 0 10*3/uL (ref 0.0–0.1)
Basophils Relative: 1 % (ref 0–1)
Eosinophils Absolute: 0.2 10*3/uL (ref 0.0–0.7)
Eosinophils Relative: 5 % (ref 0–5)
HCT: 38.8 % — ABNORMAL LOW (ref 39.0–52.0)
Hemoglobin: 13.2 g/dL (ref 13.0–17.0)
Lymphocytes Relative: 28 % (ref 12–46)
Lymphs Abs: 1.2 10*3/uL (ref 0.7–4.0)
MCH: 27.6 pg (ref 26.0–34.0)
MCHC: 34 g/dL (ref 30.0–36.0)
MCV: 81.2 fL (ref 78.0–100.0)
MPV: 9.3 fL (ref 8.6–12.4)
Monocytes Absolute: 0.5 10*3/uL (ref 0.1–1.0)
Monocytes Relative: 11 % (ref 3–12)
Neutro Abs: 2.3 10*3/uL (ref 1.7–7.7)
Neutrophils Relative %: 55 % (ref 43–77)
Platelets: 213 10*3/uL (ref 150–400)
RBC: 4.78 MIL/uL (ref 4.22–5.81)
RDW: 15.6 % — ABNORMAL HIGH (ref 11.5–15.5)
WBC: 4.2 10*3/uL (ref 4.0–10.5)

## 2015-08-24 LAB — IRON AND TIBC
%SAT: 24 % (ref 15–60)
Iron: 100 ug/dL (ref 50–180)
TIBC: 425 ug/dL (ref 250–425)
UIBC: 325 ug/dL (ref 125–400)

## 2015-10-02 ENCOUNTER — Encounter: Payer: Self-pay | Admitting: Gastroenterology

## 2017-04-15 ENCOUNTER — Ambulatory Visit (INDEPENDENT_AMBULATORY_CARE_PROVIDER_SITE_OTHER): Payer: BLUE CROSS/BLUE SHIELD | Admitting: Family Medicine

## 2017-04-15 ENCOUNTER — Encounter: Payer: Self-pay | Admitting: Family Medicine

## 2017-04-15 VITALS — BP 128/78 | HR 82 | Wt 173.8 lb

## 2017-04-15 DIAGNOSIS — J3089 Other allergic rhinitis: Secondary | ICD-10-CM | POA: Diagnosis not present

## 2017-04-15 DIAGNOSIS — R030 Elevated blood-pressure reading, without diagnosis of hypertension: Secondary | ICD-10-CM | POA: Diagnosis not present

## 2017-04-15 MED ORDER — PREDNISONE 20 MG PO TABS: ORAL_TABLET | ORAL | 0 refills | Status: DC

## 2017-04-15 MED ORDER — MONTELUKAST SODIUM 10 MG PO TABS: 10.0000 mg | ORAL_TABLET | Freq: Every day | ORAL | 6 refills | Status: DC

## 2017-04-15 NOTE — Progress Notes (Signed)
BP 128/78   Pulse 82   Wt 173 lb 12.8 oz (78.8 kg)   SpO2 97%   BMI 25.67 kg/m    CC: check blood pressure Subjective:    Patient ID: Jani GravelNorris E Mathes, male    DOB: 02/18/1961, 56 y.o.   MRN: [id number]  HPI: Jani Gravelorris E Lebow is a 56 y.o. male presenting on 04/15/2017 for Hypertension (Patient reports increases in BP over the past 1 month. He reports h/o HTN w/ BPs 130-140/80-90.  Pt denies dizziness or vision changes. [place], as well.); Headache (temporal); Nasal Congestion; and Facial Pain (bilateral)   Last seen 04/2015. Noticing bp increasing over last few weeks - has increased traveling recently and wonders if related.   Also noticing sinus congestion and pressure that affects ability to sleep. Not blowing colored mucous. He does have allergic rhinitis (predominant congestion) treated with daily flonase as well as mucinex BID regularly for the last 10 days. + PNDrainage. Maxillary sinus pressure.   No fevers/chills, ear or tooth pain, ST, cough, wheezing, dyspnea or chest pain.   Recent diverticulitis flare treated with cipro/flagyl.  He does not have asthma. Currently living with daughter and her dog.   Relevant past medical, surgical, family and social history reviewed and updated as indicated. Interim medical history since our last visit reviewed. Allergies and medications reviewed and updated. Outpatient Medications Prior to Visit  Medication Sig Dispense Refill  . Ascorbic Acid (VITAMIN C) 1000 MG tablet Take 1,000 mg by mouth daily.      Marland Kitchen. glucosamine-chondroitin 500-400 MG tablet Take 1 tablet by mouth daily.      . GUAIFENESIN PO Take by mouth as needed.      . Multiple Vitamin (MULTIVITAMIN) tablet Take 1 tablet by mouth daily.      . Vitamins-Lipotropics (B COMPLEX FORMULA 1) TABS Take 1 tablet by mouth daily.      . fluticasone (FLONASE) 50 MCG/ACT nasal spray Place 2 sprays into the nose daily as needed. 48 g 3   No  facility-administered medications prior to visit.      Per HPI unless specifically indicated in ROS section below Review of Systems     Objective:    BP 128/78   Pulse 82   Wt 173 lb 12.8 oz (78.8 kg)   SpO2 97%   BMI 25.67 kg/m   Wt Readings from Last 3 Encounters:  04/15/17 173 lb 12.8 oz (78.8 kg)  04/16/15 164 lb 8 oz (74.6 kg)  04/20/13 172 lb (78 kg)    Physical Exam  Constitutional: He appears well-developed and well-nourished. No distress.  HENT:  Head: Normocephalic and atraumatic.  Right Ear: Hearing, tympanic membrane, external ear and ear canal normal.  Left Ear: Hearing, tympanic membrane, external ear and ear canal normal.  Nose: Mucosal edema (nasal mucosal congestion/erythema) present. No rhinorrhea. Right sinus exhibits no maxillary sinus tenderness and no frontal sinus tenderness. Left sinus exhibits no maxillary sinus tenderness and no frontal sinus tenderness.  Mouth/Throat: Uvula is midline and mucous membranes are normal. Posterior oropharyngeal erythema present. No oropharyngeal exudate, posterior oropharyngeal edema or tonsillar abscesses.  Eyes: Conjunctivae and EOM are normal. Pupils are equal, round, and reactive to light. No scleral icterus.  Neck: Normal range of motion. Neck supple.  Cardiovascular: Normal rate, regular rhythm, normal heart sounds and intact distal pulses.   No murmur heard. Pulmonary/Chest: Effort normal and breath sounds normal. No respiratory distress.  He has no wheezes. He has no rales.  Lymphadenopathy:    He has no cervical adenopathy.  Skin: Skin is warm and dry. No rash noted.  Nursing note and vitals reviewed.     Assessment & Plan:   Problem List Items Addressed This Visit    Allergic rhinitis - Primary    Anticipate acute flare of allergic rhinitis leading to nasal sinus congestion, headache, facial pain. No obvious signs of bacterial infection. h/o antihistamine allergy - raises blood pressures. Continue nasal  steroid, add prednisone course and singulair trial x 1 month. Update if not improving with treatment. Pt agrees with plan. Discussed possible dog allergy contribution.       Elevated blood pressure reading    Borderline readings at home. Anticipate related to increased discomfort from sinus congestion and recent diverticulitis. Pt will continue to monitor at home. Reviewed low sodium/salt diet, recommended increased water and high potassium diet to help control blood pressures.           Follow up plan: Return if symptoms worsen or fail to improve.  Eustaquio Boyden, MD

## 2017-04-15 NOTE — Assessment & Plan Note (Signed)
Borderline readings at home. Anticipate related to increased discomfort from sinus congestion and recent diverticulitis. Pt will continue to monitor at home. Reviewed low sodium/salt diet, recommended increased water and high potassium diet to help control blood pressures.

## 2017-04-15 NOTE — Patient Instructions (Signed)
Let's watch blood pressures- watch sodium and salt in diet. Let's treat sinus congestion with prednisone course and trial of singulair daily - both sent to pharmacy.  Continue generic flonase.  Let us know if new or worsening symptoms, fever >101, worsening sinus pressure or headache or increased nasal colored mucous.

## 2017-04-15 NOTE — Assessment & Plan Note (Signed)
Anticipate acute flare of allergic rhinitis leading to nasal sinus congestion, headache, facial pain. No obvious signs of bacterial infection. h/o antihistamine allergy - raises blood pressures. Continue nasal steroid, add prednisone course and singulair trial x 1 month. Update if not improving with treatment. Pt agrees with plan. Discussed possible dog allergy contribution.

## 2017-04-28 ENCOUNTER — Ambulatory Visit (INDEPENDENT_AMBULATORY_CARE_PROVIDER_SITE_OTHER): Payer: BLUE CROSS/BLUE SHIELD | Admitting: Internal Medicine

## 2017-04-28 ENCOUNTER — Encounter: Payer: Self-pay | Admitting: Internal Medicine

## 2017-04-28 DIAGNOSIS — W57XXXA Bitten or stung by nonvenomous insect and other nonvenomous arthropods, initial encounter: Secondary | ICD-10-CM

## 2017-04-28 DIAGNOSIS — S80862A Insect bite (nonvenomous), left lower leg, initial encounter: Secondary | ICD-10-CM | POA: Diagnosis not present

## 2017-04-28 MED ORDER — DOXYCYCLINE HYCLATE 100 MG PO TABS: 100.0000 mg | ORAL_TABLET | Freq: Two times a day (BID) | ORAL | 0 refills | Status: DC

## 2017-04-28 NOTE — Progress Notes (Signed)
Subjective:  Patient ID: Jeffrey Barry, male    DOB: 05-27-1961  Age: 56 y.o. MRN: [id number]  CC: No chief complaint on file.   HPI Jeffrey Barry presents for left posterior knee insect bite since Sat - worse, redness is spreading  Outpatient Medications Prior to Visit  Medication Sig Dispense Refill  . Ascorbic Acid (VITAMIN C) 1000 MG tablet Take 1,000 mg by mouth daily.      . fluticasone (FLONASE) 50 MCG/ACT nasal spray Place 1 spray into both nostrils daily.    Marland Kitchen glucosamine-chondroitin 500-400 MG tablet Take 1 tablet by mouth daily.      . GUAIFENESIN PO Take by mouth as needed.      . Magnesium Citrate 200 MG TABS Take 1 tablet by mouth at bedtime.    . montelukast (SINGULAIR) 10 MG tablet Take 1 tablet (10 mg total) by mouth at bedtime. 30 tablet 6  . Multiple Vitamin (MULTIVITAMIN) tablet Take 1 tablet by mouth daily.      . predniSONE (DELTASONE) 20 MG tablet Take two tablets daily for 3 days followed by one tablet daily for 4 days 10 tablet 0  . Vitamins-Lipotropics (B COMPLEX FORMULA 1) TABS Take 1 tablet by mouth daily.       No facility-administered medications prior to visit.     ROS Review of Systems  Constitutional: Negative for appetite change, fatigue and unexpected weight change.  HENT: Negative for congestion, nosebleeds, sneezing, sore throat and trouble swallowing.   Eyes: Negative for itching and visual disturbance.  Respiratory: Negative for cough.   Cardiovascular: Negative for chest pain, palpitations and leg swelling.  Gastrointestinal: Negative for abdominal distention, blood in stool, diarrhea and nausea.  Genitourinary: Negative for frequency and hematuria.  Musculoskeletal: Negative for back pain, gait problem, joint swelling and neck pain.  Skin: Positive for color change. Negative for rash.  Neurological: Negative for dizziness, tremors, speech difficulty and weakness.  Psychiatric/Behavioral: Negative for agitation, dysphoric mood and sleep  disturbance. The patient is not nervous/anxious.     Objective:  BP 130/80 (BP Location: Left Arm, Patient Position: Sitting, Cuff Size: Normal)   Pulse 89   Temp 98.2 F (36.8 C) (Oral)   Ht 5\' 9"  (1.753 m)   Wt 176 lb (79.8 kg)   SpO2 98%   BMI 25.99 kg/m   BP Readings from Last 3 Encounters:  04/28/17 130/80  04/15/17 128/78  04/16/15 130/80    Wt Readings from Last 3 Encounters:  04/28/17 176 lb (79.8 kg)  04/15/17 173 lb 12.8 oz (78.8 kg)  04/16/15 164 lb 8 oz (74.6 kg)    Physical Exam  Constitutional: He is oriented to person, place, and time. He appears well-developed. No distress.  NAD  HENT:  Mouth/Throat: Oropharynx is clear and moist.  Eyes: Conjunctivae are normal. Pupils are equal, round, and reactive to light.  Neck: Normal range of motion. No JVD present. No thyromegaly present.  Cardiovascular: Normal rate, regular rhythm, normal heart sounds and intact distal pulses.  Exam reveals no gallop and no friction rub.   No murmur heard. Pulmonary/Chest: Effort normal and breath sounds normal. No respiratory distress. He has no wheezes. He has no rales. He exhibits no tenderness.  Abdominal: Soft. Bowel sounds are normal. He exhibits no distension and no mass. There is no tenderness. There is no rebound and no guarding.  Musculoskeletal: Normal range of motion. He exhibits no edema or tenderness.  Lymphadenopathy:    He has no  cervical adenopathy.  Neurological: He is alert and oriented to person, place, and time. He has normal reflexes. No cranial nerve deficit. He exhibits normal muscle tone. He displays a negative Romberg sign. Coordination and gait normal.  Skin: Skin is warm and dry. No rash noted. There is erythema.  Psychiatric: He has a normal mood and affect. His behavior is normal. Judgment and thought content normal.    left posterior knee area of aryythema and swelling 2x1 cm with a 1 mm necrotic bite center. Pink erythema distally 6x7 cm  POC US  w/a minimal cavity w/hyperechoic content <5 mm  Lab Results  Component Value Date   WBC 4.2 08/22/2015   HGB 13.2 08/22/2015   HCT 38.8 (L) 08/22/2015   PLT 213 08/22/2015   GLUCOSE 89 04/16/2015   CHOL 204 (H) 04/16/2015   TRIG 78 04/16/2015   HDL 38 (L) 04/16/2015   LDLCALC 150 (H) 04/16/2015   ALT 15 04/16/2015   AST 17 04/16/2015   NA 138 04/16/2015   K 4.9 04/16/2015   CL 101 04/16/2015   CREATININE 0.89 04/16/2015   BUN 12 04/16/2015   CO2 28 04/16/2015   PSA 3.11 08/01/2013    No results found.  Assessment & Plan:   There are no diagnoses linked to this encounter. I am having [name] start on doxycycline. I am also having him maintain his B COMPLEX FORMULA 1, multivitamin, vitamin C, GUAIFENESIN PO, glucosamine-chondroitin, fluticasone, Magnesium Citrate, predniSONE, and montelukast.  Meds ordered this encounter  Medications  . doxycycline (VIBRA-TABS) 100 MG tablet    Sig: Take 1 tablet (100 mg total) by mouth 2 (two) times daily.    Dispense:  20 tablet    Refill:  0     Follow-up: No Follow-up on file.  Sonda PrimesAlex Lexy Meininger, MD

## 2017-04-28 NOTE — Patient Instructions (Signed)
Compress with Epsom salt

## 2017-04-29 DIAGNOSIS — W57XXXA Bitten or stung by nonvenomous insect and other nonvenomous arthropods, initial encounter: Secondary | ICD-10-CM | POA: Insufficient documentation

## 2017-04-29 NOTE — Assessment & Plan Note (Signed)
I&D on Fri if needed PO Doxy for cellulitis Epsom salt compress

## 2017-11-27 ENCOUNTER — Other Ambulatory Visit: Payer: Self-pay | Admitting: Family Medicine

## 2018-09-23 ENCOUNTER — Encounter: Payer: Self-pay | Admitting: Family Medicine

## 2018-09-23 ENCOUNTER — Ambulatory Visit (INDEPENDENT_AMBULATORY_CARE_PROVIDER_SITE_OTHER): Payer: BLUE CROSS/BLUE SHIELD | Admitting: Family Medicine

## 2018-09-23 VITALS — BP 118/78 | HR 83 | Ht 69.0 in | Wt 188.8 lb

## 2018-09-23 DIAGNOSIS — L918 Other hypertrophic disorders of the skin: Secondary | ICD-10-CM

## 2018-09-23 NOTE — Progress Notes (Signed)
   Subjective:    Patient ID: Jeffrey Barry, male    DOB: 23-Aug-1961, 57 y.o.   MRN: [id number]  HPI This is a 57 yo male who presents today with skin tag on his back for 1+ years which has gotten hard and enlarged over the last couple of weeks. Change occurred after he took a trip and sat on airplane for almost 10 hours straight.   Past Medical History:  Diagnosis Date  . Allergic reaction to inhaled pollen 06/15/2011  . CTS (carpal tunnel syndrome)   . Diverticulitis 2012  . Dyslipidemia   . Hypertension 06/15/2011   resolved with weight loss  . OSA (obstructive sleep apnea)    Mild   Past Surgical History:  Procedure Laterality Date  . COLONOSCOPY  08/2010   mod diverticulosis, rec rpt 10 yrs Jarold Motto)  . hospitalization Left as 57 yo   testicular inflammation  . KNEE ARTHROSCOPY     bilateral (meniscus)   Family History  Problem Relation Age of Onset  . Heart disease Maternal Grandfather   . Kidney disease Paternal Grandfather   . Colon polyps Father 50       had 7 inches of colon removed; many polyps, noncancerous mass removed  . Cancer Maternal Grandmother        breast  . Diabetes Neg Hx   . CAD Neg Hx   . Stroke Neg Hx    Social History   Tobacco Use  . Smoking status: Never Smoker  . Smokeless tobacco: Never Used  Substance Use Topics  . Alcohol use: Yes    Comment: glass of red wine nightly (~3nights/weekly)  . Drug use: No      Review of Systems Per HPI    Objective:   Physical Exam  Constitutional: He is oriented to person, place, and time. He appears well-developed and well-nourished.  HENT:  Head: Normocephalic and atraumatic.  Eyes: Conjunctivae are normal.  Cardiovascular: Normal rate.  Pulmonary/Chest: Effort normal.  Neurological: He is alert and oriented to person, place, and time.  Skin:  Right mid back with approximately 1 cm round, blue toned skin tag. No surrounding erythema or drainage. Discussed with Dr. Ermalene Searing who agreed on  removal. Area cleaned with alcohol and sterile scissors used to clip stalk. No bleeding. Patient tolerated well. Bandage applied.   Vitals reviewed.    BP 118/78 (BP Location: Right Arm, Patient Position: Sitting, Cuff Size: Normal)   Pulse 83   Ht 5\' 9"  (1.753 m)   Wt 188 lb 12.8 oz (85.6 kg)   SpO2 98%   BMI 27.88 kg/m       Assessment & Plan:  1. Inflamed skin tag - removed without difficulty - RTC precautions reviewed   Olean Ree, FNP-BC  [facility], [facility]  09/23/2018 11:56 AM

## 2018-09-23 NOTE — Patient Instructions (Signed)
Skin Tag, Adult A skin tag (acrochordon) is a soft, extra growth of skin. Most skin tags are flesh-colored and rarely bigger than a pencil eraser. They commonly form near areas where there are folds in the skin, such as the armpit or groin. Skin tags are not dangerous, and they do not spread from person to person (are not contagious). You may have one skin tag or several. Skin tags do not require treatment. However, your health care provider may recommend removal of a skin tag if it:  Gets irritated from clothing.  Bleeds.  Is visible and unsightly.  Your health care provider can remove skin tags with a simple surgical procedure or a procedure that involves freezing the skin tag. Follow these instructions at home:  Watch for any changes in your skin tag. A normal skin tag does not require any other special care at home.  Take over-the-counter and prescription medicines only as told by your health care provider.  Keep all follow-up visits as told by your health care provider. This is important. Contact a health care provider if:  You have a skin tag that: ? Becomes painful. ? Changes color. ? Bleeds. ? Swells.  You develop more skin tags. This information is not intended to replace advice given to you by your health care provider. Make sure you discuss any questions you have with your health care provider. Document Released: 12/15/2015 Document Revised: 07/26/2016 Document Reviewed: 12/15/2015 [employer] Interactive Patient Education  2018 [employer]  

## 2018-11-07 NOTE — Progress Notes (Signed)
BP (!) 154/92 (BP Location: Right Arm, Patient Position: Sitting, Cuff Size: Normal)   Pulse 84   Temp 98 F (36.7 C) (Oral)   Ht 5\' 9"  (1.753 m)   Wt 190 lb 12 oz (86.5 kg)   SpO2 98%   BMI 28.17 kg/m   160/100 on repeat  CC: abd pain, elevated BP Subjective:    Patient ID: Jeffrey Barry, male    DOB: 07/25/1961, 57 y.o.   MRN: [id number]  HPI: Jeffrey Gravelorris E Macmullen is a 57 y.o. male presenting on 11/08/2018 for Abdominal Pain (C/o mid abd pains for about 1 wk. Also, c/o constipation. ) and Elevated BP (C/o elevated BP since 11/04/18. )   1 wk h/o increased constipation, only able to have BM with milk of magnesium or dulcolax. Mid abdomen cramping pain. Incomplete emptying of bowels. Urine is slightly darker.   No fevers/chills, dysuria, urgency, hematuria, diarrhea, nausea/vomiting.  Last BM was this morning, small. Had good BM yesterday.  H/o diverticulitis last treated 2018.  Increased stress the past 3 months. Lots of traveling. Taking fiber supplements on the road (metamucil or benefiber).   BP elevated this past week as well - to 160/100. Noticing fluctuating readings this week. He did have chinese Saturday night.   COLONOSCOPY 08/2010 - mod diverticulosis, rec rpt 10 yrs Jarold Motto([place])  Relevant past medical, surgical, family and social history reviewed and updated as indicated. Interim medical history since our last visit reviewed. Allergies and medications reviewed and updated. Outpatient Medications Prior to Visit  Medication Sig Dispense Refill  . Ascorbic Acid (VITAMIN C) 1000 MG tablet Take 1,000 mg by mouth daily.      . fluticasone (FLONASE) 50 MCG/ACT nasal spray Place 1 spray into both nostrils daily.    Marland Kitchen. glucosamine-chondroitin 500-400 MG tablet Take 1 tablet by mouth daily.      . GUAIFENESIN PO Take by mouth as needed.      . Magnesium Citrate 200 MG TABS Take 1 tablet by mouth at bedtime.    . Multiple Vitamin (MULTIVITAMIN) tablet Take 1 tablet by mouth daily.       . Vitamins-Lipotropics (B COMPLEX FORMULA 1) TABS Take 1 tablet by mouth daily.       No facility-administered medications prior to visit.      Per HPI unless specifically indicated in ROS section below Review of Systems     Objective:    BP (!) 154/92 (BP Location: Right Arm, Patient Position: Sitting, Cuff Size: Normal)   Pulse 84   Temp 98 F (36.7 C) (Oral)   Ht 5\' 9"  (1.753 m)   Wt 190 lb 12 oz (86.5 kg)   SpO2 98%   BMI 28.17 kg/m   Wt Readings from Last 3 Encounters:  11/08/18 190 lb 12 oz (86.5 kg)  09/23/18 188 lb 12.8 oz (85.6 kg)  04/28/17 176 lb (79.8 kg)    Physical Exam  Constitutional: He appears well-developed and well-nourished. No distress.  HENT:  Mouth/Throat: Oropharynx is clear and moist. No oropharyngeal exudate.  Cardiovascular: Normal rate, regular rhythm and normal heart sounds.  No murmur heard. Pulmonary/Chest: Effort normal and breath sounds normal. No respiratory distress. He has no wheezes. He has no rales.  Abdominal: Soft. Bowel sounds are normal. He exhibits no distension and no mass. There is no hepatosplenomegaly. There is tenderness (mild) in the epigastric area, periumbilical area and left lower quadrant. There is no rebound, no guarding and no CVA tenderness. No hernia.  Musculoskeletal: He exhibits no edema.  Psychiatric: He has a normal mood and affect.  Nursing note and vitals reviewed.     Assessment & Plan:   Problem List Items Addressed This Visit    Generalized abdominal pain - Primary    ?constipation vs diverticulitis vs other. Check 2 view and labs today (CBC, CMP, lipase). Low threshold to start antibiotic to cover diverticulitis. Discussed bowel regimen - miralax 1 capful daily. Update with effect.       Relevant Orders   Comprehensive metabolic panel   CBC with Differential/Platelet   Lipase   DG Abd 2 Views   Elevated blood pressure reading    Again elevated reading today, however last month's bp 118/78 in  office. Anticipate elevated BP related to abd discomfort - advised close monitoring at home. rec avoid sodium in diet, increase water intake.           No orders of the defined types were placed in this encounter.  Orders Placed This Encounter  Procedures  . DG Abd 2 Views    Standing Status:   Future    Number of Occurrences:   1    Standing Expiration Date:   01/09/2020    Order Specific Question:   Reason for Exam (SYMPTOM  OR DIAGNOSIS REQUIRED)    Answer:   abdominal pain    Order Specific Question:   Preferred imaging location?    Answer:   [facility]    Order Specific Question:   Radiology Contrast Protocol - do NOT remove file path    Answer:   [email or web address]  . Comprehensive metabolic panel  . CBC with Differential/Platelet  . Lipase    Follow up plan: Return if symptoms worsen or fail to improve.  Eustaquio Boyden, MD

## 2018-11-08 ENCOUNTER — Encounter: Payer: Self-pay | Admitting: Family Medicine

## 2018-11-08 ENCOUNTER — Ambulatory Visit (INDEPENDENT_AMBULATORY_CARE_PROVIDER_SITE_OTHER)
Admission: RE | Admit: 2018-11-08 | Discharge: 2018-11-08 | Disposition: A | Discharge status: Home / Self Care | Payer: BLUE CROSS/BLUE SHIELD | Source: Ambulatory Visit | Attending: Family Medicine | Admitting: Family Medicine

## 2018-11-08 ENCOUNTER — Ambulatory Visit (INDEPENDENT_AMBULATORY_CARE_PROVIDER_SITE_OTHER): Payer: BLUE CROSS/BLUE SHIELD | Admitting: Family Medicine

## 2018-11-08 VITALS — BP 154/92 | HR 84 | Temp 98.0°F | Ht 69.0 in | Wt 190.8 lb

## 2018-11-08 DIAGNOSIS — R030 Elevated blood-pressure reading, without diagnosis of hypertension: Secondary | ICD-10-CM | POA: Diagnosis not present

## 2018-11-08 DIAGNOSIS — R1084 Generalized abdominal pain: Secondary | ICD-10-CM | POA: Diagnosis not present

## 2018-11-08 LAB — COMPREHENSIVE METABOLIC PANEL
ALT: 17 U/L (ref 0–53)
AST: 17 U/L (ref 0–37)
Albumin: 4.5 g/dL (ref 3.5–5.2)
Alkaline Phosphatase: 57 U/L (ref 39–117)
BUN: 9 mg/dL (ref 6–23)
CO2: 30 mEq/L (ref 19–32)
Calcium: 9.2 mg/dL (ref 8.4–10.5)
Chloride: 100 mEq/L (ref 96–112)
Creatinine, Ser: 0.96 mg/dL (ref 0.40–1.50)
GFR: 85.97 mL/min (ref >60.00)
Glucose, Bld: 102 mg/dL — ABNORMAL HIGH (ref 70–99)
Potassium: 4.3 mEq/L (ref 3.5–5.1)
Sodium: 136 mEq/L (ref 135–145)
Total Bilirubin: 0.5 mg/dL (ref 0.2–1.2)
Total Protein: 7.4 g/dL (ref 6.0–8.3)

## 2018-11-08 LAB — CBC WITH DIFFERENTIAL/PLATELET
Basophils Absolute: 0 10*3/uL (ref 0.0–0.1)
Basophils Relative: 1 % (ref 0.0–3.0)
Eosinophils Absolute: 0.2 10*3/uL (ref 0.0–0.7)
Eosinophils Relative: 3.6 % (ref 0.0–5.0)
HCT: 43.7 % (ref 39.0–52.0)
Hemoglobin: 14.6 g/dL (ref 13.0–17.0)
Lymphocytes Relative: 30.2 % (ref 12.0–46.0)
Lymphs Abs: 1.3 10*3/uL (ref 0.7–4.0)
MCHC: 33.5 g/dL (ref 30.0–36.0)
MCV: 83.4 fl (ref 78.0–100.0)
Monocytes Absolute: 0.4 10*3/uL (ref 0.1–1.0)
Monocytes Relative: 10 % (ref 3.0–12.0)
Neutro Abs: 2.3 10*3/uL (ref 1.4–7.7)
Neutrophils Relative %: 55.2 % (ref 43.0–77.0)
Platelets: 237 10*3/uL (ref 150.0–400.0)
RBC: 5.24 Mil/uL (ref 4.22–5.81)
RDW: 15.8 % — ABNORMAL HIGH (ref 11.5–15.5)
WBC: 4.2 10*3/uL (ref 4.0–10.5)

## 2018-11-08 LAB — LIPASE: Lipase: 21 U/L (ref 11.0–59.0)

## 2018-11-08 NOTE — Patient Instructions (Signed)
Blood pressure is staying elevated, but likely from stomach pain. Increase water, avoid salt/sodium in the diet, monitor blood pressures and let me know if consistently >140/90 to start medicine.  Labs today. Xray today. We will be in touch with results. Start miralax 1 capful daily with large glass of water. Let us know if worsening pain.

## 2018-11-08 NOTE — Assessment & Plan Note (Signed)
Again elevated reading today, however last month's bp 118/78 in office. Anticipate elevated BP related to abd discomfort - advised close monitoring at home. rec avoid sodium in diet, increase water intake.

## 2018-11-08 NOTE — Assessment & Plan Note (Signed)
?  constipation vs diverticulitis vs other. Check 2 view and labs today (CBC, CMP, lipase). Low threshold to start antibiotic to cover diverticulitis. Discussed bowel regimen - miralax 1 capful daily. Update with effect.

## 2018-11-17 ENCOUNTER — Encounter: Payer: Self-pay | Admitting: Family Medicine

## 2020-02-23 ENCOUNTER — Ambulatory Visit: Payer: Self-pay | Admitting: Urology

## 2020-07-17 ENCOUNTER — Ambulatory Visit: Payer: Self-pay | Admitting: Urology

## 2021-01-23 ENCOUNTER — Encounter: Payer: Self-pay | Admitting: Gastroenterology
